# Patient Record
Sex: Female | Born: 2000 | Race: White | Hispanic: No | Smoking: Never smoker
Health system: Southern US, Community
[De-identification: ages and names within clinical notes are randomized; demographics above are authoritative.]

## PROBLEM LIST (undated history)

## (undated) DIAGNOSIS — J45909 Unspecified asthma, uncomplicated: Secondary | ICD-10-CM

## (undated) DIAGNOSIS — Z8719 Personal history of other diseases of the digestive system: Secondary | ICD-10-CM

## (undated) DIAGNOSIS — G901 Familial dysautonomia [Riley-Day]: Secondary | ICD-10-CM

## (undated) DIAGNOSIS — K589 Irritable bowel syndrome without diarrhea: Secondary | ICD-10-CM

## (undated) HISTORY — DX: Familial dysautonomia (riley-day): G90.1

## (undated) HISTORY — DX: Unspecified asthma, uncomplicated: J45.909

## (undated) HISTORY — DX: Irritable bowel syndrome, unspecified: K58.9

## (undated) HISTORY — PX: OTHER SURGICAL HISTORY: SHX169

## (undated) HISTORY — DX: Personal history of other diseases of the digestive system: Z87.19

---

## 2019-12-30 ENCOUNTER — Other Ambulatory Visit: Payer: Self-pay

## 2019-12-30 ENCOUNTER — Encounter: Payer: Self-pay | Admitting: Family Medicine

## 2019-12-30 ENCOUNTER — Ambulatory Visit (INDEPENDENT_AMBULATORY_CARE_PROVIDER_SITE_OTHER): Payer: No Typology Code available for payment source | Admitting: Family Medicine

## 2019-12-30 VITALS — BP 120/70 | HR 81 | Temp 99.0°F | Ht 65.0 in | Wt 134.6 lb

## 2019-12-30 DIAGNOSIS — D225 Melanocytic nevi of trunk: Secondary | ICD-10-CM | POA: Insufficient documentation

## 2019-12-30 DIAGNOSIS — R5383 Other fatigue: Secondary | ICD-10-CM | POA: Diagnosis not present

## 2019-12-30 DIAGNOSIS — J4520 Mild intermittent asthma, uncomplicated: Secondary | ICD-10-CM | POA: Diagnosis not present

## 2019-12-30 DIAGNOSIS — J45909 Unspecified asthma, uncomplicated: Secondary | ICD-10-CM | POA: Insufficient documentation

## 2019-12-30 DIAGNOSIS — R194 Change in bowel habit: Secondary | ICD-10-CM | POA: Insufficient documentation

## 2019-12-30 DIAGNOSIS — H538 Other visual disturbances: Secondary | ICD-10-CM

## 2019-12-30 DIAGNOSIS — N921 Excessive and frequent menstruation with irregular cycle: Secondary | ICD-10-CM | POA: Diagnosis not present

## 2019-12-30 LAB — POCT URINALYSIS DIP (PROADVANTAGE DEVICE)
Bilirubin, UA: NEGATIVE
Blood, UA: NEGATIVE
Glucose, UA: NEGATIVE mg/dL
Ketones, POC UA: NEGATIVE mg/dL
Leukocytes, UA: NEGATIVE
Nitrite, UA: NEGATIVE
Protein Ur, POC: NEGATIVE mg/dL
Specific Gravity, Urine: 1.015
Urobilinogen, Ur: NEGATIVE
pH, UA: 8.5 — AB (ref 5.0–8.0)

## 2019-12-30 LAB — POCT URINE PREGNANCY: Preg Test, Ur: NEGATIVE

## 2019-12-30 MED ORDER — ALBUTEROL SULFATE HFA 108 (90 BASE) MCG/ACT IN AERS: 2.0000 | INHALATION_SPRAY | Freq: Four times a day (QID) | RESPIRATORY_TRACT | 0 refills | Status: DC | PRN

## 2019-12-30 NOTE — Patient Instructions (Signed)
Call and schedule an eye exam.   Call and schedule an OB/GYN visit.   You will hear from [facility].   At some point, you may want to schedule with a dermatologist.       Obgyn Offices:   [facility] [address] [place], [place]  [facility] Address: [address] [place] [address] [place], [place] [address] Phone: [phone]  [place] [address] [place], [place] [address] Phone: [phone]  [place], [place] [address] Phone: [phone]  Here is a list of Eye Doctors that you call and schedule an appointment with.   [facility] Address: [place], [place], [place] [address] Phone: [phone]   [facility] Address: [address] [place], [place], [place] [address] Phone: [phone]  Dr. Katy Fitch Address: [address] [place], [place] [address] Phone: [phone]   Dermatology offices  [facility]: Phone #: [phone] Address: [address] [place], [place] [address]  [facility]: Phone: [phone]  Address: [address] [place], [place] [address]  [facility]: [phone] Address: [address] [place], [place] [address]  [facility] Address: [place], [place],  [address] Phone: [phone]

## 2019-12-30 NOTE — Progress Notes (Signed)
Subjective:    Patient ID: Laura Dudley, female    DOB: [birth year], 19 y.o.   MRN: [id number]  HPI Chief Complaint  Patient presents with  . new pt    new pt, discuss birth control, blurry vision that comes and goes for the last couple months, stomach pain- x 1 month dx with ibs 2017.    She is new to the practice and here to establish care. Moved here in May 2021. [place] in [place] here to live with her boyfriend. Her family is still in [place] States she is planning to take classes at [employer]  Working at [employer] on [employer].   She has several concerns today.   Blurry vision- once daily she has bilateral blurry vision. Has been worsening. Lasts 10 seconds up to 2 minutes.  No headache or dizziness. Has not seen an eye doctor in years. Visual acuity is ok  Asthma- diagnosed in her childhood. Does not currently have an albuterol inhaler.  Triggers are mainly exercise induced, also pollen.  She has been on Singulair in the past.   IBS hx per patient. No records available.  diagnosed with IBS at age 19. Takes Bentyl and states it used to work but lately it has not.  Stomach spasms still an issue.  Hx of intermittent diarrhea and constipation.  States she has been missing work because of this.  Fodmap diet is being followed.   States she has a bowel movement every hour now. She used to have bowel movements after eating. No blood in her stool.    Periods- irregular even on OCPs. Heavy and prolonged periods. She occasionally misses doses. Is interested in another form of birth control.   Complains of fatigue. Sleeping well but does not feel rested.   Mole on her back. Family history of melanoma   ADHD- no records available currently. Need to request.  States she was diagnosed in middle school and was on medication until she graduated from HS.   Denies fever, chills, dizziness, chest pain, palpitations, shortness of breath, cough.   Reviewed allergies,  medications, past medical, surgical, family, and social history.    Review of Systems Pertinent positives and negatives in the history of present illness.     Objective:   Physical Exam Constitutional:      General: She is not in acute distress.    Appearance: Normal appearance. She is not ill-appearing.  Eyes:     General: No visual field deficit.    Extraocular Movements: Extraocular movements intact.     Conjunctiva/sclera: Conjunctivae normal.     Pupils: Pupils are equal, round, and reactive to light.  Cardiovascular:     Rate and Rhythm: Normal rate and regular rhythm.     Pulses: Normal pulses.     Heart sounds: Normal heart sounds.  Pulmonary:     Effort: Pulmonary effort is normal.     Breath sounds: Normal breath sounds.  Abdominal:     General: Abdomen is flat. Bowel sounds are normal. There is no distension.     Palpations: Abdomen is soft.     Tenderness: There is no abdominal tenderness. There is no guarding or rebound.  Musculoskeletal:     Cervical back: Normal range of motion and neck supple.  Lymphadenopathy:     Cervical: No cervical adenopathy.  Skin:    General: Skin is warm and dry.     Comments: Right lower back with a 3 to 4  mm irregular, raised mole with changes and hyperpigmentation.  Neurological:     General: No focal deficit present.     Mental Status: She is alert and oriented to person, place, and time.     Cranial Nerves: No cranial nerve deficit.     Sensory: Sensation is intact.     Motor: Motor function is intact.     Coordination: Coordination is intact.  Psychiatric:        Mood and Affect: Mood normal.        Thought Content: Thought content normal.        Judgment: Judgment normal.    BP 120/70   Pulse 81   Temp 99 F (37.2 C) (Oral)   Ht 5\' 5"  (1.651 m)   Wt 134 lb 9.6 oz (61.1 kg)   LMP 11/29/2019   BMI 22.40 kg/m        Assessment & Plan:  Mild intermittent asthma without complication - Plan: albuterol (VENTOLIN  HFA) 108 (90 Base) MCG/ACT inhaler -Asthma since childhood. No recent flares. I will prescribe albuterol since she has been out of this. Counseling on the fact that if she is needing this more than twice per week that she should let me know because her asthma is not controlled. Consider starting her back on Singulair if needed for allergies.  Blurry vision, bilateral - Plan: POCT Urinalysis DIP (Proadvantage Device) -Visual acuity is fine. No headaches or dizziness associated. No red flag symptoms. I did discuss my concern and recommend she call and schedule with an ophthalmologist. She agrees.  Fatigue, unspecified type - Plan: CBC with Differential/Platelet, TSH, T4, free, T3, Vitamin B12, VITAMIN D 25 Hydroxy (Vit-D Deficiency, Fractures), Iron, TIBC and Ferritin Panel, Comprehensive metabolic panel, POCT Urinalysis DIP (Proadvantage Device), POCT urine pregnancy -Discussed multiple etiologies for fatigue including anemia. We will check labs and follow-up  Menorrhagia with irregular cycle - Plan: TSH, T4, free, T3, Iron, TIBC and Ferritin Panel -Currently taking birth control pills. She does occasionally miss a dose. UPT is negative today. She is interested in other forms of birth control. She will call and schedule with an OB/GYN. I did provide her with a list.  Bowel habit changes - Plan: Ambulatory referral to Gastroenterology -Reportedly has a history of IBS diagnosed in [place]. No records available today. Recent bowel habit changes are concerning. I will refer her to GI for further evaluation  Breakthrough bleeding on birth control pills - Plan: POCT urine pregnancy -UPT negative. Refer to OB/GYN  Atypical nevus- She will call and schedule with a dermatologist

## 2019-12-31 ENCOUNTER — Encounter: Payer: Self-pay | Admitting: Gastroenterology

## 2019-12-31 ENCOUNTER — Encounter: Payer: Self-pay | Admitting: Family Medicine

## 2019-12-31 DIAGNOSIS — E559 Vitamin D deficiency, unspecified: Secondary | ICD-10-CM

## 2019-12-31 HISTORY — DX: Vitamin D deficiency, unspecified: E55.9

## 2019-12-31 LAB — CBC WITH DIFFERENTIAL/PLATELET
Basophils Absolute: 0 10*3/uL (ref 0.0–0.2)
Basos: 0 %
EOS (ABSOLUTE): 0.1 10*3/uL (ref 0.0–0.4)
Eos: 1 %
Hematocrit: 38.3 % (ref 34.0–46.6)
Hemoglobin: 12.8 g/dL (ref 11.1–15.9)
Immature Grans (Abs): 0 10*3/uL (ref 0.0–0.1)
Immature Granulocytes: 0 %
Lymphocytes Absolute: 1.6 10*3/uL (ref 0.7–3.1)
Lymphs: 24 %
MCH: 27.9 pg (ref 26.6–33.0)
MCHC: 33.4 g/dL (ref 31.5–35.7)
MCV: 84 fL (ref 79–97)
Monocytes Absolute: 0.5 10*3/uL (ref 0.1–0.9)
Monocytes: 7 %
Neutrophils Absolute: 4.5 10*3/uL (ref 1.4–7.0)
Neutrophils: 68 %
Platelets: 253 10*3/uL (ref 150–450)
RBC: 4.58 x10E6/uL (ref 3.77–5.28)
RDW: 12 % (ref 11.7–15.4)
WBC: 6.6 10*3/uL (ref 3.4–10.8)

## 2019-12-31 LAB — COMPREHENSIVE METABOLIC PANEL
ALT: 15 IU/L (ref 0–32)
AST: 14 IU/L (ref 0–40)
Albumin/Globulin Ratio: 1.8 (ref 1.2–2.2)
Albumin: 4.3 g/dL (ref 3.9–5.0)
Alkaline Phosphatase: 91 IU/L (ref 42–106)
BUN/Creatinine Ratio: 10 (ref 9–23)
BUN: 7 mg/dL (ref 6–20)
Bilirubin Total: 0.3 mg/dL (ref 0.0–1.2)
CO2: 22 mmol/L (ref 20–29)
Calcium: 9.3 mg/dL (ref 8.7–10.2)
Chloride: 103 mmol/L (ref 96–106)
Creatinine, Ser: 0.72 mg/dL (ref 0.57–1.00)
GFR calc Af Amer: 139 mL/min/{1.73_m2} (ref >59)
GFR calc non Af Amer: 121 mL/min/{1.73_m2} (ref >59)
Globulin, Total: 2.4 g/dL (ref 1.5–4.5)
Glucose: 79 mg/dL (ref 65–99)
Potassium: 4 mmol/L (ref 3.5–5.2)
Sodium: 138 mmol/L (ref 134–144)
Total Protein: 6.7 g/dL (ref 6.0–8.5)

## 2019-12-31 LAB — T4, FREE: Free T4: 1.16 ng/dL (ref 0.82–1.77)

## 2019-12-31 LAB — IRON,TIBC AND FERRITIN PANEL
Ferritin: 59 ng/mL (ref 15–150)
Iron Saturation: 19 % (ref 15–55)
Iron: 63 ug/dL (ref 27–159)
Total Iron Binding Capacity: 340 ug/dL (ref 250–450)
UIBC: 277 ug/dL (ref 131–425)

## 2019-12-31 LAB — TSH: TSH: 0.995 u[IU]/mL (ref 0.450–4.500)

## 2019-12-31 LAB — VITAMIN B12: Vitamin B-12: 656 pg/mL (ref 232–1245)

## 2019-12-31 LAB — VITAMIN D 25 HYDROXY (VIT D DEFICIENCY, FRACTURES): Vit D, 25-Hydroxy: 24 ng/mL — ABNORMAL LOW (ref 30.0–100.0)

## 2019-12-31 LAB — T3: T3, Total: 164 ng/dL (ref 71–180)

## 2020-01-15 ENCOUNTER — Telehealth: Payer: Self-pay | Admitting: Family Medicine

## 2020-01-15 NOTE — Telephone Encounter (Signed)
Received requested records from [facility]

## 2020-02-17 ENCOUNTER — Encounter: Payer: Self-pay | Admitting: Gastroenterology

## 2020-02-17 ENCOUNTER — Encounter: Payer: Self-pay | Admitting: Family Medicine

## 2020-02-17 ENCOUNTER — Ambulatory Visit (INDEPENDENT_AMBULATORY_CARE_PROVIDER_SITE_OTHER): Payer: No Typology Code available for payment source | Admitting: Gastroenterology

## 2020-02-17 VITALS — BP 100/72 | HR 94 | Ht 65.0 in | Wt 135.0 lb

## 2020-02-17 DIAGNOSIS — K582 Mixed irritable bowel syndrome: Secondary | ICD-10-CM | POA: Diagnosis not present

## 2020-02-17 DIAGNOSIS — F909 Attention-deficit hyperactivity disorder, unspecified type: Secondary | ICD-10-CM

## 2020-02-17 HISTORY — DX: Attention-deficit hyperactivity disorder, unspecified type: F90.9

## 2020-02-17 MED ORDER — DICYCLOMINE HCL 10 MG PO CAPS: 10.0000 mg | ORAL_CAPSULE | Freq: Three times a day (TID) | ORAL | 11 refills | Status: DC

## 2020-02-17 NOTE — Patient Instructions (Addendum)
If you are age 20 or older, your body mass index should be between 23-30. Your Body mass index is 22.47 kg/m. If this is out of the aforementioned range listed, please consider follow up with your Primary Care Provider.  If you are age 69 or younger, your body mass index should be between 19-25. Your Body mass index is 22.47 kg/m. If this is out of the aformentioned range listed, please consider follow up with your Primary Care Provider.   We have sent the following medications to your pharmacy for you to pick up at your convenience: Dicyclomine 10 mg three times daily before meals.  Start IB guard three times daily   We have provided a low fodmap for you to follow.   Thank you for choosing me and [facility].  Pricilla Riffle. Dagoberto Ligas., MD., Marval Regal

## 2020-02-17 NOTE — Progress Notes (Signed)
History of Present Illness: This is a 19 year old female referred by Harland Dingwall L, NP-C for the evaluation of abdominal pain and alternating bowel habits.  She relates a history of IBS with an alternating bowel pattern diagnosed at age 67.  Over time she has determined that she is very sensitive to gluten and moderately sensitive to lactose.  She strictly avoids gluten and only allows a small amount of lactose which does not cause problems.  She recently discovered that ketchup induces symptoms and she recently eliminated it from her diet with a substantial improvement in pain and diarrhea.  Her bowel pattern alternates from mild constipation to normal to significant diarrhea. Abdominal pain is generally upper however it can generalize. Sometimes she notes low back pain with severe diarrhea.  She has not previously had colonoscopy.  She feels dicyclomine is helpful for pain however if she takes it TID it often causes constipation. She relocated here from [facility] and has established with Mack Hook, NP-C as her PCP.  CMP, CBC, iron studies, TSH, B12 all normal in Sept. Vit D was low at 24 in Sept. Denies weight loss, change in stool caliber, melena, hematochezia, nausea, vomiting, dysphagia, reflux symptoms, chest pain.    Not on File Outpatient Medications Prior to Visit  Medication Sig Dispense Refill  . albuterol (VENTOLIN HFA) 108 (90 Base) MCG/ACT inhaler Inhale 2 puffs into the lungs every 6 (six) hours as needed for wheezing or shortness of breath. 8 g 0  . dicyclomine (BENTYL) 20 MG tablet Take 20 mg by mouth daily.    . Norgestimate-Ethinyl Estradiol Triphasic (TRI-LO-SPRINTEC) 0.18/0.215/0.25 MG-25 MCG tab Take 1 tablet by mouth daily.     No facility-administered medications prior to visit.   Past Medical History:  Diagnosis Date  . Asthma   . H/O pyloric stenosis   . IBS (irritable bowel syndrome)   . Vitamin D deficiency 12/31/2019   Past Surgical History:  Procedure  Laterality Date  . pyloric stenosis repair     Social History   Socioeconomic History  . Marital status: Single    Spouse name: Not on file  . Number of children: Not on file  . Years of education: Not on file  . Highest education level: Not on file  Occupational History  . Not on file  Tobacco Use  . Smoking status: Never Smoker  . Smokeless tobacco: Never Used  Vaping Use  . Vaping Use: Never used  Substance and Sexual Activity  . Alcohol use: Not Currently  . Drug use: Yes    Types: Marijuana  . Sexual activity: Yes    Birth control/protection: OCP  Other Topics Concern  . Not on file  Social History Narrative  . Not on file   Social Determinants of Health   Financial Resource Strain:   . Difficulty of Paying Living Expenses: Not on file  Food Insecurity:   . Worried About Charity fundraiser in the Last Year: Not on file  . Ran Out of Food in the Last Year: Not on file  Transportation Needs:   . Lack of Transportation (Medical): Not on file  . Lack of Transportation (Non-Medical): Not on file  Physical Activity:   . Days of Exercise per Week: Not on file  . Minutes of Exercise per Session: Not on file  Stress:   . Feeling of Stress : Not on file  Social Connections:   . Frequency of Communication with Friends and Family:  Not on file  . Frequency of Social Gatherings with Friends and Family: Not on file  . Attends Religious Services: Not on file  . Active Member of Clubs or Organizations: Not on file  . Attends Archivist Meetings: Not on file  . Marital Status: Not on file   Family History  Problem Relation Age of Onset  . Asthma Mother   . Gout Father   . Bipolar disorder Maternal Grandmother   . Depression Maternal Grandmother   . Melanoma Paternal Grandfather   . Colon cancer Neg Hx   . Stomach cancer Neg Hx       Review of Systems: Pertinent positive and negative review of systems were noted in the above HPI section. All other review  of systems were otherwise negative.   Physical Exam: General: Well developed, well nourished, no acute distress Head: Normocephalic and atraumatic Eyes:  sclerae anicteric, EOMI Ears: Normal auditory acuity Mouth: Not examined, mask on during Covid-19 pandemic Neck: Supple, no masses or thyromegaly Lungs: Clear throughout to auscultation Heart: Regular rate and rhythm; no murmurs, rubs or bruits Abdomen: Soft, non tender and non distended. No masses, hepatosplenomegaly or hernias noted. Normal Bowel sounds Rectal: Not done Musculoskeletal: Symmetrical with no gross deformities  Skin: No lesions on visible extremities Pulses:  Normal pulses noted Extremities: No clubbing, cyanosis, edema or deformities noted Neurological: Alert oriented x 4, grossly nonfocal Cervical Nodes:  No significant cervical adenopathy Inguinal Nodes: No significant inguinal adenopathy Psychological:  Alert and cooperative. Normal mood and affect   Assessment and Recommendations:  1. IBS with an alternating pattern. Severe intolerance to gluten and moderate intolerance to lactose. Celiac testing has not been done as she has been gluten free. Ketchup recently found to induce abdominal pain and diarrhea.   Closely review low FODMAP diet for additional dietary stressors.  Maintain a regular high daily water intake.  Reduce dicyclomine to 10 mg po tid prn.  IBGard 1-2 po tid prn if dicyclomine is not fully effective.  Miralax qd prn constipation.  Trial of a few different probiotics for 2-3 weeks each if symptoms not are well controlled.  Colonoscopy and HLA DQ8 and DQ2 if symptoms are not well controlled to R/O IBD, neoplasm, celiac disease.  Attempt to obtain and review records from her prior PCP in [place].  REV in 1 year if symptoms are well controlled.  Strongly encouraged to schedule GI follow up prior to her annual visit if she is not well controlled.   2.  Pyloric stenosis as an infant, status post  myotomy.   cc: Girtha Rm, NP-C [place],   [phone]

## 2020-10-29 ENCOUNTER — Encounter: Payer: Self-pay | Admitting: Family Medicine

## 2020-11-01 NOTE — Progress Notes (Signed)
Chief Complaint  Patient presents with   Wrist Pain    Right wrist pain. Broke wrist 2 years ago. Was doing ok. Started bothering her again a couple weeks ago. Looking for a referral to ortho.     Patient reports fracturing her right wrist in January of 2021. She was treated with a cast, and physical therapy.  She is requesting referral to ortho due to complaints of recurrent R wrist pain for the last 3-4 months.  Manager of a garage--moves tires, heavy things.  No pain at rest, but any pressure--opening a door, pushing on heavy doors, or turning handles sometimes, causes pain.  No numbness/tingling Pain is at the back of the wrist and base of the thumb    PMH, PSH, SH reviewed  H/o IBS and stomach issues  Outpatient Encounter Medications as of 11/02/2020  Medication Sig Note   dicyclomine (BENTYL) 10 MG capsule Take 1 capsule (10 mg total) by mouth 3 (three) times daily before meals.    Multiple Vitamin (MULTI-VITAMIN PO) Take 2 each by mouth. 11/02/2020: Gummy   albuterol (VENTOLIN HFA) 108 (90 Base) MCG/ACT inhaler Inhale 2 puffs into the lungs every 6 (six) hours as needed for wheezing or shortness of breath. (Patient not taking: Reported on 11/02/2020)    [DISCONTINUED] Norgestimate-Ethinyl Estradiol Triphasic 0.18/0.215/0.25 MG-25 MCG tab Take 1 tablet by mouth daily.    No facility-administered encounter medications on file as of 11/02/2020.     No Known Allergies Recalls having an issue with an acid medication--no idea which one it was to record as an allergy (throat closed up, r/b benadryl).  ROS: Denies fever, chills, URI symptoms, headaches, dizziness, shortness of breath, chest pain.  Denies nausea, vomiting, bowel changes, urinary complaints, bleeding, bruising, rash. Currently denies abdominal pain See HPI   PHYSICAL EXAM:  BP 108/64   Pulse 64   Ht '5\' 5"'$  (1.651 m)   Wt 131 lb 6.4 oz (59.6 kg)   BMI 21.87 kg/m   Well-appearing, pleasant female, in no  distress Right Wrist--FROM, normal appearance, no redness or swelling Pain with R wrist extension against resistance. No pain with flexion against resistance Negative Tinel and Phalen Mildly tender over distal radius Negative Finkelstein test  ASSESSMENT/PLAN:  Right wrist pain - will treat for poss overuse/tendonitis with brace and NSAID (risks/SE reviewed).  - Plan: DG Wrist Complete Right  History of fracture of wrist - check XR to ensure proper healing from prior fracture - Plan: DG Wrist Complete Right   Refer to ortho if persistent pain, or abnormal x-ray

## 2020-11-02 ENCOUNTER — Other Ambulatory Visit: Payer: Self-pay

## 2020-11-02 ENCOUNTER — Ambulatory Visit (INDEPENDENT_AMBULATORY_CARE_PROVIDER_SITE_OTHER): Payer: BC Managed Care – PPO | Admitting: Family Medicine

## 2020-11-02 ENCOUNTER — Encounter: Payer: Self-pay | Admitting: Family Medicine

## 2020-11-02 VITALS — BP 108/64 | HR 64 | Ht 65.0 in | Wt 131.4 lb

## 2020-11-02 DIAGNOSIS — M25531 Pain in right wrist: Secondary | ICD-10-CM

## 2020-11-02 DIAGNOSIS — Z8781 Personal history of (healed) traumatic fracture: Secondary | ICD-10-CM | POA: Diagnosis not present

## 2020-11-02 NOTE — Patient Instructions (Addendum)
Go to [facility] (301 or 315) for xray of the wrist.   Use a wrist brace during the day (from pharmacy, ie carpal tunnel brace) Take Aleve twice daily with food. Prescription equivalent is 2 pills twice daily, but let's start with 1 sice you have stomach issues, and since I'm not sure which stomach medication would be safe to give you if you start having pain (I usually suggest Pepcid or Prilosec, to take along with the anti-inflammatory if it bothers your stomach).  Take Aleve for up to 2 weeks (usually 7-10 days is enough), until your pain has resolved. If pain isn't improving, let us know and we can refer you to the orthopedist.  I'll refer you sooner if any abnormality on x-ray (poor healing, etc).

## 2020-11-03 ENCOUNTER — Ambulatory Visit
Admission: RE | Admit: 2020-11-03 | Discharge: 2020-11-03 | Disposition: A | Discharge status: Home / Self Care | Payer: BC Managed Care – PPO | Source: Ambulatory Visit | Attending: Family Medicine | Admitting: Family Medicine

## 2020-11-03 DIAGNOSIS — M25531 Pain in right wrist: Secondary | ICD-10-CM

## 2020-11-03 DIAGNOSIS — Z8781 Personal history of (healed) traumatic fracture: Secondary | ICD-10-CM

## 2020-11-12 ENCOUNTER — Other Ambulatory Visit (INDEPENDENT_AMBULATORY_CARE_PROVIDER_SITE_OTHER): Payer: BC Managed Care – PPO

## 2020-11-12 ENCOUNTER — Ambulatory Visit (INDEPENDENT_AMBULATORY_CARE_PROVIDER_SITE_OTHER): Payer: BC Managed Care – PPO | Admitting: Gastroenterology

## 2020-11-12 ENCOUNTER — Encounter: Payer: Self-pay | Admitting: Gastroenterology

## 2020-11-12 VITALS — BP 90/60 | HR 110 | Ht 64.0 in | Wt 130.0 lb

## 2020-11-12 DIAGNOSIS — K588 Other irritable bowel syndrome: Secondary | ICD-10-CM

## 2020-11-12 DIAGNOSIS — R1011 Right upper quadrant pain: Secondary | ICD-10-CM

## 2020-11-12 LAB — COMPREHENSIVE METABOLIC PANEL
ALT: 13 U/L (ref 0–35)
AST: 14 U/L (ref 0–37)
Albumin: 4.3 g/dL (ref 3.5–5.2)
Alkaline Phosphatase: 60 U/L (ref 39–117)
BUN: 10 mg/dL (ref 6–23)
CO2: 23 mEq/L (ref 19–32)
Calcium: 9.1 mg/dL (ref 8.4–10.5)
Chloride: 105 mEq/L (ref 96–112)
Creatinine, Ser: 0.79 mg/dL (ref 0.40–1.20)
GFR: 107.04 mL/min (ref >60.00)
Glucose, Bld: 89 mg/dL (ref 70–99)
Potassium: 4 mEq/L (ref 3.5–5.1)
Sodium: 138 mEq/L (ref 135–145)
Total Bilirubin: 0.6 mg/dL (ref 0.2–1.2)
Total Protein: 7.1 g/dL (ref 6.0–8.3)

## 2020-11-12 LAB — CBC WITH DIFFERENTIAL/PLATELET
Basophils Absolute: 0 10*3/uL (ref 0.0–0.1)
Basophils Relative: 0.3 % (ref 0.0–3.0)
Eosinophils Absolute: 0 10*3/uL (ref 0.0–0.7)
Eosinophils Relative: 1 % (ref 0.0–5.0)
HCT: 38.1 % (ref 36.0–46.0)
Hemoglobin: 12.7 g/dL (ref 12.0–15.0)
Lymphocytes Relative: 25.9 % (ref 12.0–46.0)
Lymphs Abs: 1.1 10*3/uL (ref 0.7–4.0)
MCHC: 33.3 g/dL (ref 30.0–36.0)
MCV: 86.4 fl (ref 78.0–100.0)
Monocytes Absolute: 0.3 10*3/uL (ref 0.1–1.0)
Monocytes Relative: 6.4 % (ref 3.0–12.0)
Neutro Abs: 2.9 10*3/uL (ref 1.4–7.7)
Neutrophils Relative %: 66.4 % (ref 43.0–77.0)
Platelets: 197 10*3/uL (ref 150.0–400.0)
RBC: 4.41 Mil/uL (ref 3.87–5.11)
RDW: 12.6 % (ref 11.5–15.5)
WBC: 4.4 10*3/uL (ref 4.0–10.5)

## 2020-11-12 LAB — LIPASE: Lipase: 33 U/L (ref 11.0–59.0)

## 2020-11-12 NOTE — Progress Notes (Signed)
    History of Present Illness: This is a 20 year old female who relates intermittent right upper quadrant pain for 1 month.  Symptoms are not necessarily related to meals or bowel movements.  They have been associated with nausea without vomiting.  She she relates her new RUQ pain are quite different than the abdominal pain and bowel habit variations she often notes with certain foods such as gluten or lactose products.  RUQ pain does not radiate and there is no burning quality.  Right upper quadrant pain is not relieved with dicyclomine or IBgard which have been effective for controlling her prior abdominal pain.  Current Medications, Allergies, Past Medical History, Past Surgical History, Family History and Social History were reviewed in [employer] record.   Physical Exam: General: Well developed, well nourished, no acute distress Head: Normocephalic and atraumatic Eyes: Sclerae anicteric, EOMI Ears: Normal auditory acuity Mouth: Not examined, mask on during Covid-19 pandemic Lungs: Clear throughout to auscultation Heart: Regular rate and rhythm; no murmurs, rubs or bruits Abdomen: Soft, non tender and non distended. No masses, hepatosplenomegaly or hernias noted. Normal Bowel sounds Rectal: Not done  Musculoskeletal: Symmetrical with no gross deformities  Pulses:  Normal pulses noted Extremities: No clubbing, cyanosis, edema or deformities noted Neurological: Alert oriented x 4, grossly nonfocal Psychological:  Alert and cooperative. Normal mood and affect   Assessment and Recommendations:  RUQ pain.  Rule out cholelithiasis, gastritis, ulcer. CBC, CMP, lipase.  Schedule RUQ [place].  Prilosec OTC 20 mg daily.  Schedule EGD. The risks (including bleeding, perforation, infection, missed lesions, medication reactions and possible hospitalization or surgery if complications occur), benefits, and alternatives to endoscopy with possible biopsy and possible dilation were  discussed with the patient and they consent to proceed.  IBS vs food intolerances.  Encouraged to use IBgard 1-2 p.o. 3 times daily as needed and reduce the use of dicyclomine as it leads to constipation.  She strictly avoids gluten and minimizes lactose.  She has noted several other foods on the low FODMAP diet that cause abdominal pain bowel habit changes and she avoids them.  Send HLA DQ.

## 2020-11-12 NOTE — Patient Instructions (Signed)
Your provider has requested that you go to the basement level for lab work before leaving today. Press "B" on the elevator. The lab is located at the first door on the left as you exit the elevator.  You have been scheduled for an endoscopy. Please follow written instructions given to you at your visit today. If you use inhalers (even only as needed), please bring them with you on the day of your procedure.  You have been scheduled for an abdominal ultrasound at [facility] (1st floor of hospital) on 11/16/20 at 8:00am. Please arrive 15 minutes prior to your appointment for registration. Make certain not to have anything to eat or drink 6 hours prior to your appointment. Should you need to reschedule your appointment, please contact radiology at [phone]. This test typically takes about 30 minutes to perform.  Due to recent changes in healthcare laws, you may see the results of your imaging and laboratory studies on MyChart before your provider has had a chance to review them.  We understand that in some cases there may be results that are confusing or concerning to you. Not all laboratory results come back in the same time frame and the provider may be waiting for multiple results in order to interpret others.  Please give [place] 48 hours in order for your provider to thoroughly review all the results before contacting the office for clarification of your results.   The [facility] providers would like to encourage you to use [facility] to communicate with providers for non-urgent requests or questions.  Due to long hold times on the telephone, sending your provider a message by [facility] may be a faster and more efficient way to get a response.  Please allow 48 business hours for a response.  Please remember that this is for non-urgent requests.   Normal BMI (Body Mass Index- based on height and weight) is between 19 and 25. Your BMI today is Body mass index is 22.31 kg/m. Marland Kitchen Please consider follow up   regarding your BMI with your Primary Care Provider.  Thank you for choosing me and [facility].  Pricilla Riffle. Dagoberto Ligas., MD., Marval Regal

## 2020-11-16 ENCOUNTER — Ambulatory Visit (HOSPITAL_COMMUNITY)
Admission: RE | Admit: 2020-11-16 | Discharge: 2020-11-16 | Disposition: A | Discharge status: Home / Self Care | Payer: BC Managed Care – PPO | Source: Ambulatory Visit | Attending: Gastroenterology | Admitting: Gastroenterology

## 2020-11-16 ENCOUNTER — Other Ambulatory Visit: Payer: Self-pay

## 2020-11-16 DIAGNOSIS — R1011 Right upper quadrant pain: Secondary | ICD-10-CM | POA: Insufficient documentation

## 2020-11-16 DIAGNOSIS — K588 Other irritable bowel syndrome: Secondary | ICD-10-CM | POA: Diagnosis present

## 2020-11-23 LAB — CELIAC DISEASE HLA DQ ASSOC.
DQ2 (DQA1 0501/0505,DQB1 02XX): NEGATIVE
DQ8 (DQA1 03XX, DQB1 0302): POSITIVE

## 2020-12-02 ENCOUNTER — Encounter: Payer: BC Managed Care – PPO | Admitting: Gastroenterology

## 2020-12-07 ENCOUNTER — Ambulatory Visit (AMBULATORY_SURGERY_CENTER): Payer: BC Managed Care – PPO | Admitting: Gastroenterology

## 2020-12-07 ENCOUNTER — Other Ambulatory Visit: Payer: Self-pay

## 2020-12-07 ENCOUNTER — Encounter: Payer: Self-pay | Admitting: Gastroenterology

## 2020-12-07 ENCOUNTER — Other Ambulatory Visit: Payer: Self-pay | Admitting: Gastroenterology

## 2020-12-07 VITALS — BP 108/56 | HR 70 | Temp 99.1°F | Resp 11 | Ht 64.0 in | Wt 130.0 lb

## 2020-12-07 DIAGNOSIS — R1011 Right upper quadrant pain: Secondary | ICD-10-CM | POA: Diagnosis not present

## 2020-12-07 DIAGNOSIS — K319 Disease of stomach and duodenum, unspecified: Secondary | ICD-10-CM | POA: Diagnosis not present

## 2020-12-07 MED ORDER — SODIUM CHLORIDE 0.9 % IV SOLN: 500.0000 mL | Freq: Once | INTRAVENOUS | Status: DC

## 2020-12-07 NOTE — Progress Notes (Signed)
Pt's states no medical or surgical changes since previsit or office visit. 

## 2020-12-07 NOTE — Progress Notes (Signed)
1007 Robinul 0.1 mg IV given due large amount of secretions upon assessment.  MD made aware, vss 

## 2020-12-07 NOTE — Patient Instructions (Signed)
Resume previous diet and medications. Awaiting pathology results.  YOU HAD AN ENDOSCOPIC PROCEDURE TODAY AT [facility]:   Refer to the procedure report that was given to you for any specific questions about what was found during the examination.  If the procedure report does not answer your questions, please call your gastroenterologist to clarify.  If you requested that your care partner not be given the details of your procedure findings, then the procedure report has been included in a sealed envelope for you to review at your convenience later.  YOU SHOULD EXPECT: Some feelings of bloating in the abdomen. Passage of more gas than usual.  Walking can help get rid of the air that was put into your GI tract during the procedure and reduce the bloating. If you had a lower endoscopy (such as a colonoscopy or flexible sigmoidoscopy) you may notice spotting of blood in your stool or on the toilet paper. If you underwent a bowel prep for your procedure, you may not have a normal bowel movement for a few days.  Please Note:  You might notice some irritation and congestion in your nose or some drainage.  This is from the oxygen used during your procedure.  There is no need for concern and it should clear up in a day or so.  SYMPTOMS TO REPORT IMMEDIATELY:  Following upper endoscopy (EGD)  Vomiting of blood or coffee ground material  New chest pain or pain under the shoulder blades  Painful or persistently difficult swallowing  New shortness of breath  Fever of 100F or higher  Black, tarry-looking stools  For urgent or emergent issues, a gastroenterologist can be reached at any hour by calling [phone]. Do not use MyChart messaging for urgent concerns.    DIET:  We do recommend a small meal at first, but then you may proceed to your regular diet.  Drink plenty of fluids but you should avoid alcoholic beverages for 24 hours.  ACTIVITY:  You should plan to take it easy for the  rest of today and you should NOT DRIVE or use heavy machinery until tomorrow (because of the sedation medicines used during the test).    FOLLOW UP: Our staff will call the number listed on your records 48-72 hours following your procedure to check on you and address any questions or concerns that you may have regarding the information given to you following your procedure. If we do not reach you, we will leave a message.  We will attempt to reach you two times.  During this call, we will ask if you have developed any symptoms of COVID 19. If you develop any symptoms (ie: fever, flu-like symptoms, shortness of breath, cough etc.) before then, please call [phone].  If you test positive for Covid 19 in the 2 weeks post procedure, please call and report this information to [place].    If any biopsies were taken you will be contacted by phone or by letter within the next 1-3 weeks.  Please call us at [phone] if you have not heard about the biopsies in 3 weeks.    SIGNATURES/CONFIDENTIALITY: You and/or your care partner have signed paperwork which will be entered into your electronic medical record.  These signatures attest to the fact that that the information above on your After Visit Summary has been reviewed and is understood.  Full responsibility of the confidentiality of this discharge information lies with you and/or your care-partner.

## 2020-12-07 NOTE — Progress Notes (Signed)
C.W. vital signs. 

## 2020-12-07 NOTE — Progress Notes (Signed)
Report given to PACU, vss 

## 2020-12-07 NOTE — Op Note (Signed)
[place] Patient Name: Laura Dudley Procedure Date: 12/07/2020 10:00 AM MRN: [id number] Endoscopist: Ladene Artist , MD Age: 20 Referring MD:  Date of Birth: 09/30/2000 Gender: Female Account #: [id number] Procedure:                Upper GI endoscopy Indications:              Abdominal pain in the right upper quadrant Medicines:                Monitored Anesthesia Care Procedure:                Pre-Anesthesia Assessment:                           - Prior to the procedure, a History and Physical                            was performed, and patient medications and                            allergies were reviewed. The patient's tolerance of                            previous anesthesia was also reviewed. The risks                            and benefits of the procedure and the sedation                            options and risks were discussed with the patient.                            All questions were answered, and informed consent                            was obtained. Prior Anticoagulants: The patient has                            taken no previous anticoagulant or antiplatelet                            agents. ASA Grade Assessment: II - A patient with                            mild systemic disease. After reviewing the risks                            and benefits, the patient was deemed in                            satisfactory condition to undergo the procedure.                           After obtaining informed consent, the endoscope was  passed under direct vision. Throughout the                            procedure, the patient's blood pressure, pulse, and                            oxygen saturations were monitored continuously. The                            Endoscope was introduced through the mouth, and                            advanced to the second part of duodenum. The upper                            GI  endoscopy was accomplished without difficulty.                            The patient tolerated the procedure well. Scope In: Scope Out: Findings:                 The examined esophagus was normal.                           Patchy mildly erythematous mucosa without bleeding                            was found in the gastric body. Biopsies were taken                            with a cold forceps for histology.                           The exam of the stomach was otherwise normal.                           The duodenal bulb and second portion of the                            duodenum were normal. Biopsies for histology were                            taken with a cold forceps for evaluation of celiac                            disease. Complications:            No immediate complications. Estimated Blood Loss:     Estimated blood loss was minimal. Impression:               - Normal esophagus.                           - Erythematous mucosa in the gastric body. Biopsied.                           -  Normal duodenal bulb and second portion of the                            duodenum. Biopsied. Recommendation:           - Patient has a contact number available for                            emergencies. The signs and symptoms of potential                            delayed complications were discussed with the                            patient. Return to normal activities tomorrow.                            Written discharge instructions were provided to the                            patient.                           - Resume previous diet.                           - Continue present medications.                           - Await pathology results. Ladene Artist, MD 12/07/2020 10:22:54 AM This report has been signed electronically.

## 2020-12-07 NOTE — Progress Notes (Signed)
See 11/12/2020 H&P, no changes. This patient is appropriate for endoscopic procedures in the ambulatory setting.

## 2020-12-07 NOTE — Progress Notes (Signed)
Called to room to assist during endoscopic procedure.  Patient ID and intended procedure confirmed with present staff. Received instructions for my participation in the procedure from the performing physician.  

## 2020-12-09 ENCOUNTER — Telehealth: Payer: Self-pay

## 2020-12-09 NOTE — Telephone Encounter (Signed)
  Follow up Call-  Call back number 12/07/2020  Post procedure Call Back phone  # [phone]  Permission to leave phone message Yes     Patient questions:  Do you have a fever, pain , or abdominal swelling? No. Pain Score  0 *  Have you tolerated food without any problems? Yes.    Have you been able to return to your normal activities? Yes.    Do you have any questions about your discharge instructions: Diet   No. Medications  No. Follow up visit  No.  Do you have questions or concerns about your Care? No.  Actions: * If pain score is 4 or above: No action needed, pain <4.

## 2020-12-10 ENCOUNTER — Telehealth: Payer: Self-pay | Admitting: *Deleted

## 2020-12-10 NOTE — Telephone Encounter (Signed)
Pt called back this morning and she has been experiencing mid chest discomfort constantly 4/10 at the worst 7/10. "It bruns, I'm belching and have a lot of gas." Will let Dr. Fuller Plan know of this new symptom. Told the patient we were still awaiting pathology results.

## 2020-12-10 NOTE — Telephone Encounter (Signed)
Light diet for next 1-2 days Gas-X qid prn  Either IBgard (OTC) 1-2 po tid prn abdominal pain or FDgard (OTC) 1-2 po tid prn abdominal pain

## 2020-12-10 NOTE — Telephone Encounter (Signed)
Called the patient back and gave her the instructions  suggested by Dr. Fuller Plan. She verbalized understanding, I encouraged her to reach out on Tuesday if the symptoms didn't improve.

## 2020-12-24 ENCOUNTER — Encounter: Payer: Self-pay | Admitting: Gastroenterology

## 2020-12-28 NOTE — Telephone Encounter (Signed)
Pathology showed reactive gastropathy.  She is c/o continued abdominal pain.  She is not on a PPI.  Would you recommend any additional tx

## 2020-12-29 ENCOUNTER — Other Ambulatory Visit: Payer: Self-pay

## 2020-12-29 DIAGNOSIS — R1011 Right upper quadrant pain: Secondary | ICD-10-CM

## 2020-12-29 MED ORDER — FAMOTIDINE 40 MG PO TABS: 40.0000 mg | ORAL_TABLET | Freq: Every day | ORAL | 1 refills | Status: DC

## 2021-01-21 ENCOUNTER — Other Ambulatory Visit: Payer: Self-pay | Admitting: Gastroenterology

## 2021-02-04 ENCOUNTER — Other Ambulatory Visit: Payer: Self-pay | Admitting: Gastroenterology

## 2021-02-12 ENCOUNTER — Ambulatory Visit: Payer: BC Managed Care – PPO | Admitting: Gastroenterology

## 2021-06-07 ENCOUNTER — Other Ambulatory Visit: Payer: Self-pay

## 2021-06-07 ENCOUNTER — Ambulatory Visit (INDEPENDENT_AMBULATORY_CARE_PROVIDER_SITE_OTHER): Payer: BLUE CROSS/BLUE SHIELD | Admitting: Medical

## 2021-06-07 VITALS — BP 102/74 | HR 101 | Temp 99.1°F | Ht 64.0 in | Wt 125.8 lb

## 2021-06-07 DIAGNOSIS — R232 Flushing: Secondary | ICD-10-CM

## 2021-06-07 DIAGNOSIS — R0600 Dyspnea, unspecified: Secondary | ICD-10-CM | POA: Diagnosis not present

## 2021-06-07 DIAGNOSIS — G4489 Other headache syndrome: Secondary | ICD-10-CM

## 2021-06-07 DIAGNOSIS — N926 Irregular menstruation, unspecified: Secondary | ICD-10-CM

## 2021-06-07 DIAGNOSIS — R000 Tachycardia, unspecified: Secondary | ICD-10-CM | POA: Diagnosis not present

## 2021-06-07 DIAGNOSIS — H538 Other visual disturbances: Secondary | ICD-10-CM

## 2021-06-07 NOTE — Addendum Note (Signed)
Addended by: Carlena Hurl on: 06/07/2021 03:39 PM   Modules accepted: Orders

## 2021-06-07 NOTE — Progress Notes (Signed)
Subjective: Chief Complaint  Patient presents with   Asthma    More frequent labored breathing with or without activity No chest pains,  lightheaded some times  Started on 05/24/21 and has been on and off    Here for multiple symptoms. For the past week and a half she has felt symptoms including flushed hot flashes type feelings, has felt short of breath, headaches lately, vomited once, but the headache.  She has seen Jocelyn Lamer here before for blurred vision and saw an eye doctor in July 2021 for the same.  Reportedly there was nothing on her eyes.  Nevertheless she still gets blurred vision periodically.  She has a history of IBS diarrhea.  She has been on Bentyl once to twice daily since age 75 for this issue.  She notes a history of irregular periods for a long time.  She just finished up her last menstrual period.  Some months it can be 4 days, some days 8 days.  A few months ago she had 2 periods in 1 month.  She can get very bad crampy pain during periods.  Sexually active, uses condoms.  Has been on various birth controls in the past and nothing seems to regulate her periods.  She denies history of incontinence, ataxia.  No numbness or tingling.  She has had a few concussions in her lifetime.  The last 1 maybe a year ago and she hit her head on the car door.  She notes some stress but nothing out of the ordinary.  She does not think this is related to anxiety  She does not think this is related to asthma either because her inhaler does not seem to change her shortness of breath and does not feel like asthma.  She has not like she is got really bad allergy problems right now either.  She does tend to get some springtime allergy problems but this does not feel that way.  She is a non-smoker.  She has a history of vitamin D deficiency but not currently taking vitamin D..  She is using a multivitamin  No other aggravating or relieving factors. No other complaint.  Past Medical History:   Diagnosis Date   ADHD 02/17/2020   Asthma    H/O pyloric stenosis    IBS (irritable bowel syndrome)    Vitamin D deficiency 12/31/2019   Family History  Problem Relation Age of Onset   Asthma Mother    Gout Father    Bipolar disorder Maternal Grandmother    Depression Maternal Grandmother    Melanoma Paternal Grandfather    Colon cancer Neg Hx    Stomach cancer Neg Hx    Current Outpatient Medications on File Prior to Visit  Medication Sig Dispense Refill   albuterol (VENTOLIN HFA) 108 (90 Base) MCG/ACT inhaler Inhale 2 puffs into the lungs every 6 (six) hours as needed for wheezing or shortness of breath. 8 g 0   dicyclomine (BENTYL) 10 MG capsule Take 1 capsule (10 mg total) by mouth 3 (three) times daily before meals. 90 capsule 11   Multiple Vitamin (MULTI-VITAMIN PO) Take 2 each by mouth.     famotidine (PEPCID) 40 MG tablet TAKE 1 TABLET BY MOUTH EVERYDAY AT BEDTIME (Patient not taking: Reported on 06/07/2021) 90 tablet 1   Current Facility-Administered Medications on File Prior to Visit  Medication Dose Route Frequency Provider Last Rate Last Admin   0.9 %  sodium chloride infusion  500 mL Intravenous Once Ladene Artist,  MD        ROS as in subjective   Objective: BP 102/74    Pulse (!) 101    Temp 99.1 F (37.3 C)    Ht 5\' 4"  (1.626 m)    Wt 125 lb 12.8 oz (57.1 kg)    SpO2 98%    BMI 21.59 kg/m     General appearence: alert, no distress, WD/WN, lean white female No obvious hirsutism HEENT: normocephalic, sclerae anicteric, PERRLA, EOMi, nares patent, no discharge or erythema, pharynx normal Oral cavity: MMM, no lesions Neck: supple, no lymphadenopathy, no thyromegaly, no masses Heart: RRR, normal S1, S2, no murmurs Lungs: CTA bilaterally, no wheezes, rhonchi, or rales Extremities: no edema, no cyanosis, no clubbing Pulses: 2+ symmetric, upper and lower extremities, normal cap refill Neurological: alert, oriented x 3, CN2-12 intact, strength normal upper  extremities and lower extremities, sensation normal throughout, DTRs 2+ throughout, no cerebellar signs, gait normal Psychiatric: normal affect, behavior normal, pleasant   Visual fields normal   Assessment: Encounter Diagnoses  Name Primary?   Headache syndrome Yes   Blurred vision    Flushing    Dyspnea, unspecified type    Elevated pulse rate    Irregular periods     Plan: We discussed symptoms and concerns.  We discussed possible differential -asthma, anxiety, thyroid issues, other neuroendocrine issues.  Labs as below.  Consider head imaging or referral to either neurology or endocrinology depending on lab results.   Donelle was seen today for asthma.  Diagnoses and all orders for this visit:  Headache syndrome -     TSH -     Prolactin -     CBC -     T4, free  Blurred vision -     TSH -     Prolactin -     CBC -     T4, free  Flushing -     TSH -     Prolactin -     CBC -     T4, free  Dyspnea, unspecified type -     TSH -     Prolactin -     CBC -     T4, free  Elevated pulse rate -     TSH -     Prolactin -     CBC -     T4, free  Irregular periods -     TSH -     Prolactin -     CBC -     T4, free   F/u pending labs

## 2021-06-07 NOTE — Addendum Note (Signed)
Addended by: Carlena Hurl on: 06/07/2021 03:52 PM   Modules accepted: Orders

## 2021-06-08 LAB — CBC
Hematocrit: 41.4 % (ref 34.0–46.6)
Hemoglobin: 13.7 g/dL (ref 11.1–15.9)
MCH: 28.5 pg (ref 26.6–33.0)
MCHC: 33.1 g/dL (ref 31.5–35.7)
MCV: 86 fL (ref 79–97)
Platelets: 218 10*3/uL (ref 150–450)
RBC: 4.81 x10E6/uL (ref 3.77–5.28)
RDW: 12 % (ref 11.7–15.4)
WBC: 5 10*3/uL (ref 3.4–10.8)

## 2021-06-08 LAB — BETA HCG QUANT (REF LAB): hCG Quant: <1 m[IU]/mL

## 2021-06-08 LAB — PROLACTIN: Prolactin: 10.8 ng/mL (ref 4.8–23.3)

## 2021-06-08 LAB — TSH: TSH: 0.832 u[IU]/mL (ref 0.450–4.500)

## 2021-06-08 LAB — T4, FREE: Free T4: 1.11 ng/dL (ref 0.82–1.77)

## 2021-06-20 DIAGNOSIS — B085 Enteroviral vesicular pharyngitis: Secondary | ICD-10-CM | POA: Diagnosis not present

## 2021-06-24 ENCOUNTER — Other Ambulatory Visit: Payer: Self-pay | Admitting: Medical

## 2021-06-24 NOTE — Progress Notes (Signed)
refe

## 2021-06-25 ENCOUNTER — Other Ambulatory Visit: Payer: Self-pay | Admitting: Medical

## 2021-06-25 ENCOUNTER — Telehealth: Payer: Self-pay

## 2021-06-25 DIAGNOSIS — R079 Chest pain, unspecified: Secondary | ICD-10-CM | POA: Diagnosis not present

## 2021-06-25 DIAGNOSIS — R000 Tachycardia, unspecified: Secondary | ICD-10-CM | POA: Diagnosis not present

## 2021-06-25 DIAGNOSIS — Z87898 Personal history of other specified conditions: Secondary | ICD-10-CM

## 2021-06-25 NOTE — Telephone Encounter (Signed)
Pt states she has been checking it with her apple watch several times a day. Ranging from 98-130. Right now it was 107. ?

## 2021-06-25 NOTE — Telephone Encounter (Signed)
Pt was notified.  

## 2021-06-25 NOTE — Progress Notes (Signed)
cardio

## 2021-06-25 NOTE — Telephone Encounter (Signed)
Pt states she went to Urgent Care this morning for high heart rate and they recommend referral to Cardiologist,  never been before so whoever is fine.  She states she saw you a couple weeks ago and had discussed increased heart rate with you then.  Said if she needs to come back in here to let her know.

## 2021-06-26 DIAGNOSIS — R000 Tachycardia, unspecified: Secondary | ICD-10-CM | POA: Diagnosis not present

## 2021-06-29 ENCOUNTER — Other Ambulatory Visit: Payer: Self-pay

## 2021-06-29 ENCOUNTER — Ambulatory Visit (INDEPENDENT_AMBULATORY_CARE_PROVIDER_SITE_OTHER): Payer: BC Managed Care – PPO

## 2021-06-29 ENCOUNTER — Encounter: Payer: Self-pay | Admitting: Internal Medicine

## 2021-06-29 ENCOUNTER — Ambulatory Visit (INDEPENDENT_AMBULATORY_CARE_PROVIDER_SITE_OTHER): Payer: BC Managed Care – PPO | Admitting: Internal Medicine

## 2021-06-29 VITALS — BP 96/56 | HR 76 | Ht 64.0 in | Wt 125.4 lb

## 2021-06-29 DIAGNOSIS — R002 Palpitations: Secondary | ICD-10-CM

## 2021-06-29 NOTE — Patient Instructions (Signed)
Medication Instructions:  ?No Changes In Medications at this time.  ?*If you need a refill on your cardiac medications before your next appointment, please call your pharmacy* ? ?Testing/Procedures: ? ?ZIO XT- Long Term Monitor Instructions  ? ?Your physician has requested you wear your ZIO patch monitor___7____days.  ? ?This is a single patch monitor.  [employer] supplies one patch monitor per enrollment.  Additional stickers are not available. ?  ?Please do not apply patch if you will be having a Nuclear Stress Test, Echocardiogram, Cardiac CT, MRI, or Chest Xray during the time frame you would be wearing the monitor. The patch cannot be worn during these tests.  You cannot remove and re-apply the ZIO XT patch monitor. ?  ?Your ZIO patch monitor will be sent USPS Priority mail from [facility] directly to your home address. The monitor may also be mailed to a PO BOX if home delivery is not available.   It may take 3-5 days to receive your monitor after you have been enrolled. ?  ?Once you have received you monitor, please review enclosed instructions.  Your monitor has already been registered assigning a specific monitor serial # to you. ?  ?Applying the monitor  ? ?Shave hair from upper left chest. ?  ?Hold abrader disc by orange tab.  Rub abrader in 40 strokes over left upper chest as indicated in your monitor instructions. ?  ?Clean area with 4 enclosed alcohol pads .  Use all pads to assure are is cleaned thoroughly.  Let dry.  ? ?Apply patch as indicated in monitor instructions.  Patch will be place under collarbone on left side of chest with arrow pointing upward. ?  ?Rub patch adhesive wings for 2 minutes.Remove white label marked "1".  Remove white label marked "2".  Rub patch adhesive wings for 2 additional minutes. ?  ?While looking in a mirror, press and release button in center of patch.  A small green light will flash 3-4 times .  This will be your only indicator the monitor has been turned  on. ?    ?Do not shower for the first 24 hours.  You may shower after the first 24 hours. ?  ?Press button if you feel a symptom. You will hear a small click.  Record Date, Time and Symptom in the Patient Log Book. ?  ?When you are ready to remove patch, follow instructions on last 2 pages of Patient Log Book.  Stick patch monitor onto last page of Patient Log Book. ?  ?Place Patient Log Book in [facility] box.  Use locking tab on box and tape box closed securely.  The [employer] has [employer] on it.  Please place in mailbox as soon as possible.  Your physician should have your test results approximately 7 days after the monitor has been mailed back to [facility]. ?  ?Call [facility] at [phone] if you have questions regarding your ZIO XT patch monitor.  Call them immediately if you see an orange light blinking on your monitor. ?  ?If your monitor falls off in less than 4 days contact our Monitor department at [phone].  If your monitor becomes loose or falls off after 4 days call [employer] at [phone] for suggestions on securing your monitor.  ? ?Follow-Up: ?At [facility], you and your health needs are our priority.  As part of our continuing mission to provide you with exceptional heart care, we have created designated Provider Care Teams.  These Care Teams include your primary Cardiologist (physician) and Advanced Practice Providers (APPs -  Physician Assistants and Nurse Practitioners) who all work together to provide you with the care you need, when you need it. ? ?Your next appointment:   ?AS NEEDED  ? ?The format for your next appointment:   ?In Person ? ?Provider:   ?Janina Mayo, MD   ?

## 2021-06-29 NOTE — Progress Notes (Unsigned)
Enrolled for [employer] to mail a ZIO XT long term holter monitor to the patients address on file.  

## 2021-06-29 NOTE — Progress Notes (Signed)
?Cardiology Office Note:   ? ?Date:  06/29/2021  ? ?ID:  Laura Dudley, DOB 2000/11/24, MRN [id number] ? ?PCP:  Girtha Rm, NP-C ?  ?[facility] Providers ?Cardiologist:  None    ? ?Referring MD: Carlena Hurl, PA-C  ? ?No chief complaint on file. ?High heart rates ? ?History of Present Illness:   ? ?Laura Dudley is a 21 y.o. female with a hx of IBS, has multiple ROS +, anxiety, referred by PA Chana Bode ? ?She notes that on her apple watch that her HR went to 168 on her apple watch. She notes sitting and it was 150.  Notes her mother has diagnosis of SVT. Uncle may have had Mi in his 88s.  No family hx of SCD. No syncopal events. At times feels presyncopal.  ? ?TSH- normal ?Hgb- 13 ?Crt- normal ? ?Past Medical History:  ?Diagnosis Date  ? ADHD 02/17/2020  ? Asthma   ? H/O pyloric stenosis   ? IBS (irritable bowel syndrome)   ? Vitamin D deficiency 12/31/2019  ? ? ?Past Surgical History:  ?Procedure Laterality Date  ? pyloric stenosis repair    ? ? ?Current Medications: ?Current Meds  ?Medication Sig  ? albuterol (VENTOLIN HFA) 108 (90 Base) MCG/ACT inhaler Inhale 2 puffs into the lungs every 6 (six) hours as needed for wheezing or shortness of breath.  ? cetirizine (ZYRTEC) 10 MG tablet Take 10 mg by mouth daily.  ? Cyanocobalamin (VITAMIN B12 PO) Take by mouth daily.  ? dicyclomine (BENTYL) 10 MG capsule Take 1 capsule (10 mg total) by mouth 3 (three) times daily before meals.  ? Multiple Vitamin (MULTI-VITAMIN PO) Take 2 each by mouth.  ? VITAMIN D PO Take by mouth daily.  ? ?Current Facility-Administered Medications for the 06/29/21 encounter (Office Visit) with Janina Mayo, MD  ?Medication  ? 0.9 %  sodium chloride infusion  ?  ? ?Allergies:   Patient has no known allergies.  ? ?Social History  ? ?Socioeconomic History  ? Marital status: Single  ?  Spouse name: Not on file  ? Number of children: Not on file  ? Years of education: Not on file  ? Highest education level: Not on file  ?Occupational  History  ? Not on file  ?Tobacco Use  ? Smoking status: Never  ? Smokeless tobacco: Never  ?Vaping Use  ? Vaping Use: Never used  ?Substance and Sexual Activity  ? Alcohol use: Not Currently  ? Drug use: Yes  ?  Types: Marijuana  ? Sexual activity: Yes  ?  Birth control/protection: OCP  ?Other Topics Concern  ? Not on file  ?Social History Narrative  ? Not on file  ? ?Social Determinants of Health  ? ?Financial Resource Strain: Not on file  ?Food Insecurity: Not on file  ?Transportation Needs: Not on file  ?Physical Activity: Not on file  ?Stress: Not on file  ?Social Connections: Not on file  ?  ? ?Family History: ?The patient's family history includes Asthma in her mother; Bipolar disorder in her maternal grandmother; Depression in her maternal grandmother; Gout in her father; Melanoma in her paternal grandfather. There is no history of Colon cancer or Stomach cancer. ? ?ROS:   ?Please see the history of present illness.    ? All other systems reviewed and are negative. ? ?EKGs/Labs/Other Studies Reviewed:   ? ?The following studies were reviewed today: ? ? ?EKG:  EKG is  ordered today.  The ekg ordered today  demonstrates  ? ?NSR, sinus arrhythmia ? ?Recent Labs: ?11/12/2020: ALT 13; BUN 10; Creatinine, Ser 0.79; Potassium 4.0; Sodium 138 ?06/07/2021: Hemoglobin 13.7; Platelets 218; TSH 0.832  ?Recent Lipid Panel ?No results found for: CHOL, TRIG, HDL, CHOLHDL, VLDL, LDLCALC, LDLDIRECT ? ? ?Risk Assessment/Calculations:   ?  ? ?    ? ?Physical Exam:   ? ?VS:  BP (!) 96/56   Pulse 76   Ht '5\' 4"'$  (1.626 m)   Wt 125 lb 6.4 oz (56.9 kg)   LMP 06/26/2021   SpO2 98%   BMI 21.52 kg/m?    ? ?Wt Readings from Last 3 Encounters:  ?06/29/21 125 lb 6.4 oz (56.9 kg)  ?06/07/21 125 lb 12.8 oz (57.1 kg)  ?12/07/20 130 lb (59 kg)  ?  ? ?GEN:  Well nourished, well developed in no acute distress ?HEENT: Normal ?NECK: No JVD; No carotid bruits ?LYMPHATICS: No lymphadenopathy ?CARDIAC: RRR, no murmurs, rubs, gallops ?RESPIRATORY:   Clear to auscultation without rales, wheezing or rhonchi  ?ABDOMEN: Soft, non-tender, non-distended ?MUSCULOSKELETAL:  No edema; No deformity  ?SKIN: Warm and dry ?NEUROLOGIC:  Alert and oriented x 3 ?PSYCHIATRIC:  Normal affect  ? ?ASSESSMENT:   ? ?1. Palpitations   ?She does not have high risk features including syncope c/f arrhythmia , family hx of SCD, or abnormalities on her EKG. ? ?PLAN:   ? ?In order of problems listed above: ? ?Ziopatch 1 week ?Follow up as needed ? ?   ? ?   ? ? ?Medication Adjustments/Labs and Tests Ordered: ?Current medicines are reviewed at length with the patient today.  Concerns regarding medicines are outlined above.  ?Orders Placed This Encounter  ?Procedures  ? EKG 12-Lead  ? ?No orders of the defined types were placed in this encounter. ? ? ?There are no Patient Instructions on file for this visit.  ? ?Signed, ?Janina Mayo, MD  ?06/29/2021 8:21 AM    ?[place] ?

## 2021-07-01 ENCOUNTER — Telehealth: Payer: Self-pay

## 2021-07-01 NOTE — Telephone Encounter (Signed)
Spoke with patient and advised her of the below. Patient would like the monitor applied using the Skin tac to decrease issue with adhesive on the skin. Advised patient I would forward message over to [facility] to see if patient can get scheduled to have monitor applied at [facility]. Patient verbalized understanding.  ?

## 2021-07-01 NOTE — Telephone Encounter (Signed)
Attempted to call patient, left message for patient to call back to office.   

## 2021-07-01 NOTE — Telephone Encounter (Signed)
-----   Message from Jennefer Bravo sent at 06/29/2021  5:26 PM EDT ----- ?Regarding: RE: Monitor adhesive ?No sorry, but the order was put in specifically for a ZIO XT monitor by [employer].  They do not offer a monitor/patch for sensitive skin.  Patient should receive her monitor in 2-3 days.  If she would like, we could schedule her to come to our [facility] office once she receives her monitor.  When can apply the monitor using Skin Tac, which, may provide a slight barrier between the patch and her skin. ? ?Otherwise they can cancel the Long Term Monitor order for (ZIO), patient can mail back unused monitor once she receives it.  A new order could be placed for a Long Term Monitor from ([employer]), we can specifiy the patient receive a bridge with sensitive skin electrodes, but even this is not a guarantee, she will not have a reaction or irritation. ? ?Please let us know how you decide to proceed. ? ?Thanks,  ?Shelly ?----- Message ----- ?From: Chriss Driver, RN ?Sent: 06/29/2021   2:46 PM EDT ?To: Jennefer Bravo, [place] ?Subject: Monitor adhesive                              ? ?Patient was ordered a 7 day zio today by Dr. Harl Bowie- she said that she has sensitive skin- is there any way to get sensitive skin adhesive? ? ?Thank you  ?Eliezer Lofts b. RN  ? ? ?

## 2021-07-02 ENCOUNTER — Other Ambulatory Visit: Payer: Self-pay

## 2021-07-02 DIAGNOSIS — R002 Palpitations: Secondary | ICD-10-CM | POA: Diagnosis not present

## 2021-07-02 NOTE — Telephone Encounter (Signed)
Patient coming in 3/24 @ 3:00 to have monitor applied ?

## 2021-07-28 DIAGNOSIS — R079 Chest pain, unspecified: Secondary | ICD-10-CM | POA: Diagnosis not present

## 2021-07-28 DIAGNOSIS — R0781 Pleurodynia: Secondary | ICD-10-CM | POA: Diagnosis not present

## 2021-08-12 ENCOUNTER — Encounter: Payer: Self-pay | Admitting: Physician Assistant

## 2021-08-12 ENCOUNTER — Ambulatory Visit (INDEPENDENT_AMBULATORY_CARE_PROVIDER_SITE_OTHER): Payer: BC Managed Care – PPO | Admitting: Physician Assistant

## 2021-08-12 VITALS — BP 110/70 | HR 79 | Ht 64.0 in | Wt 130.6 lb

## 2021-08-12 DIAGNOSIS — M79646 Pain in unspecified finger(s): Secondary | ICD-10-CM

## 2021-08-12 DIAGNOSIS — R208 Other disturbances of skin sensation: Secondary | ICD-10-CM

## 2021-08-12 DIAGNOSIS — R5381 Other malaise: Secondary | ICD-10-CM

## 2021-08-12 DIAGNOSIS — Z6822 Body mass index (BMI) 22.0-22.9, adult: Secondary | ICD-10-CM

## 2021-08-12 DIAGNOSIS — R200 Anesthesia of skin: Secondary | ICD-10-CM

## 2021-08-12 DIAGNOSIS — R202 Paresthesia of skin: Secondary | ICD-10-CM

## 2021-08-12 DIAGNOSIS — R5383 Other fatigue: Secondary | ICD-10-CM

## 2021-08-12 DIAGNOSIS — E559 Vitamin D deficiency, unspecified: Secondary | ICD-10-CM

## 2021-08-12 DIAGNOSIS — M79643 Pain in unspecified hand: Secondary | ICD-10-CM

## 2021-08-12 DIAGNOSIS — D229 Melanocytic nevi, unspecified: Secondary | ICD-10-CM

## 2021-08-12 DIAGNOSIS — I7300 Raynaud's syndrome without gangrene: Secondary | ICD-10-CM

## 2021-08-12 DIAGNOSIS — Z87898 Personal history of other specified conditions: Secondary | ICD-10-CM

## 2021-08-12 DIAGNOSIS — R209 Unspecified disturbances of skin sensation: Secondary | ICD-10-CM

## 2021-08-12 HISTORY — DX: Body mass index (BMI) 22.0-22.9, adult: Z68.22

## 2021-08-12 MED ORDER — METHYLPREDNISOLONE 4 MG PO TBPK: ORAL_TABLET | ORAL | 0 refills | Status: DC

## 2021-08-12 NOTE — Patient Instructions (Signed)
You will get a call to schedule an appointment with Dermatology ?

## 2021-08-12 NOTE — Assessment & Plan Note (Signed)
Stable, vit d level checked today, takes OTC Vitamin D ?

## 2021-08-12 NOTE — Assessment & Plan Note (Signed)
Stable, will monitor 

## 2021-08-12 NOTE — Assessment & Plan Note (Signed)
Ziopatch normal, will monitor ?

## 2021-08-12 NOTE — Assessment & Plan Note (Signed)
Stable, warm hands as needed, CBC checked today ?

## 2021-08-12 NOTE — Progress Notes (Signed)
? ?Acute Office Visit ? ?Subjective:  ? ? Patient ID: Laura Dudley, female    DOB: 02-09-2001, 21 y.o.   MRN: [id number] ? ?Chief Complaint  ?Patient presents with  ? Acute Visit  ?  Face has been burning for a few days. She has not used anything new. She has also been problems with her finger tips getting cold.  ? ? ?HPI ?Patient is in today reporting that her skin is burning on her face, ears, and neck x 2 weeks, worse for 3 days; skin feels hot, like pins and needles, and an uncomfortable irritating feeling that is somewhat painful; has not taken anything OTC for it; states she has sensitive skin; has been using the same moisturizer for a year; has 3 cats that she's had for a while; denies any new skin irritates; denies cough; is also having allergy symptoms of runny nose and slight headache; takes Claritin every day ? ?Also reports that her fingers get very cold, but not sweaty, and look slightly blue few weeks ? ?Also reports blurred vision, went to the eye doctor and was all normal; has Neuro appointment to follow up ? ?Also states she had an evaluation with Cardiology for palpitations and is still having some; per chart review, Ziopatch was normal ? ?Also reports  having moles on her back and requests to go to Dermatology for further evaluation of them  ? ?Outpatient Medications Prior to Visit  ?Medication Sig Dispense Refill  ? albuterol (VENTOLIN HFA) 108 (90 Base) MCG/ACT inhaler Inhale 2 puffs into the lungs every 6 (six) hours as needed for wheezing or shortness of breath. 8 g 0  ? Cyanocobalamin (VITAMIN B12 PO) Take by mouth daily.    ? dicyclomine (BENTYL) 10 MG capsule Take 1 capsule (10 mg total) by mouth 3 (three) times daily before meals. 90 capsule 11  ? Loratadine (CLARITIN) 10 MG CAPS Take by mouth.    ? VITAMIN D PO Take by mouth daily.    ? cetirizine (ZYRTEC) 10 MG tablet Take 10 mg by mouth daily. (Patient not taking: Reported on 08/12/2021)    ? Multiple Vitamin (MULTI-VITAMIN PO) Take 2  each by mouth. (Patient not taking: Reported on 08/12/2021)    ? ?Facility-Administered Medications Prior to Visit  ?Medication Dose Route Frequency Provider Last Rate Last Admin  ? 0.9 %  sodium chloride infusion  500 mL Intravenous Once Ladene Artist, MD      ? ? ?No Known Allergies ? ?Review of Systems  ?Constitutional:  Negative for activity change and chills.  ?HENT:  Positive for postnasal drip and rhinorrhea. Negative for congestion and voice change.   ?Eyes:  Negative for pain and redness.  ?Respiratory:  Negative for cough and wheezing.   ?Cardiovascular:  Positive for palpitations. Negative for chest pain.  ?Gastrointestinal:  Negative for constipation, diarrhea, nausea and vomiting.  ?Endocrine: Negative for polyuria.  ?Genitourinary:  Negative for frequency.  ?Skin:  Positive for color change. Negative for pallor, rash and wound.  ?Allergic/Immunologic: Negative for immunocompromised state.  ?Neurological:  Positive for numbness. Negative for dizziness.  ?Psychiatric/Behavioral:  Negative for agitation.   ? ?   ?Objective:  ?  ?Physical Exam ?Vitals and nursing note reviewed.  ?Constitutional:   ?   General: She is not in acute distress. ?   Appearance: Normal appearance. She is not ill-appearing.  ?HENT:  ?   Head: Normocephalic and atraumatic.  ?   Right Ear: External ear normal.  ?  Left Ear: External ear normal.  ?   Nose: No congestion.  ?Eyes:  ?   Extraocular Movements: Extraocular movements intact.  ?   Conjunctiva/sclera: Conjunctivae normal.  ?   Pupils: Pupils are equal, round, and reactive to light.  ?Cardiovascular:  ?   Rate and Rhythm: Normal rate and regular rhythm.  ?   Pulses: Normal pulses.  ?   Heart sounds: Normal heart sounds.  ?Pulmonary:  ?   Effort: Pulmonary effort is normal.  ?   Breath sounds: Normal breath sounds. No wheezing.  ?Abdominal:  ?   General: Bowel sounds are normal.  ?   Palpations: Abdomen is soft.  ?Musculoskeletal:     ?   General: Normal range of motion.  ?    Right hand: Normal. No swelling. Normal range of motion. Normal pulse.  ?   Left hand: Normal. No swelling. Normal range of motion. Normal pulse.  ?   Cervical back: Normal range of motion and neck supple.  ?   Right lower leg: No edema.  ?   Left lower leg: No edema.  ?Skin: ?   General: Skin is warm and dry.  ?   Coloration: Skin is not pale.  ?   Findings: Lesion present. No bruising, erythema or rash.  ?Neurological:  ?   General: No focal deficit present.  ?   Mental Status: She is alert and oriented to person, place, and time.  ?Psychiatric:     ?   Mood and Affect: Mood normal.     ?   Behavior: Behavior normal.     ?   Thought Content: Thought content normal.  ? ? ?BP 110/70   Pulse 79   Ht '5\' 4"'$  (1.626 m)   Wt 130 lb 9.6 oz (59.2 kg)   LMP 07/26/2021 (Approximate)   SpO2 99%   BMI 22.42 kg/m?  ? ?Wt Readings from Last 3 Encounters:  ?08/12/21 130 lb 9.6 oz (59.2 kg)  ?06/29/21 125 lb 6.4 oz (56.9 kg)  ?06/07/21 125 lb 12.8 oz (57.1 kg)  ? ? ?Results for orders placed or performed in visit on 06/07/21  ?TSH  ?Result Value Ref Range  ? TSH 0.832 0.450 - 4.500 uIU/mL  ?Prolactin  ?Result Value Ref Range  ? Prolactin 10.8 4.8 - 23.3 ng/mL  ?CBC  ?Result Value Ref Range  ? WBC 5.0 3.4 - 10.8 x10E3/uL  ? RBC 4.81 3.77 - 5.28 x10E6/uL  ? Hemoglobin 13.7 11.1 - 15.9 g/dL  ? Hematocrit 41.4 34.0 - 46.6 %  ? MCV 86 79 - 97 fL  ? MCH 28.5 26.6 - 33.0 pg  ? MCHC 33.1 31.5 - 35.7 g/dL  ? RDW 12.0 11.7 - 15.4 %  ? Platelets 218 150 - 450 x10E3/uL  ?T4, free  ?Result Value Ref Range  ? Free T4 1.11 0.82 - 1.77 ng/dL  ?Beta hCG quant (ref lab)  ?Result Value Ref Range  ? hCG Quant <1 mIU/mL  ? ? ?   ?Assessment & Plan:  ?1. Burning sensation of skin ?- CBC with Differential/Platelet ?- Comprehensive metabolic panel ?- TSH + free T4 ?- Vitamin B12 ?- Ambulatory referral to Dermatology ?- Medrol Dosepak ? ?2. Cold extremities ?- CBC with Differential/Platelet ? ?3. Malaise and fatigue ?- CBC with  Differential/Platelet ?- Comprehensive metabolic panel ?- TSH + free T4 ? ?4. Numbness and tingling ?- Vitamin B12 ?- can start OTC Vit B 12 supplements ? ?5. Vitamin D deficiency ?-  VITAMIN D 25 Hydroxy (Vit-D Deficiency, Fractures) ?- continue OTC Vit D supplements ? ?6. Pain in hand and fingers ?- Vitamin B12 ?- VITAMIN D 25 Hydroxy (Vit-D Deficiency, Fractures) ?- can start OTC Vit B 12 supplements ? ?7. Atypical mole ?- Ambulatory referral to Dermatology ?- on lower back ? ?8. History of palpitations ?- stable, has already had evaluation with Cardiology, Ziopatch normal ? ?9. Body mass index (BMI) of 22.0 to 22.9 in adult ? ? ? ?No orders of the defined types were placed in this encounter. ? ? ?Return in about 1 year (around 08/13/2022) for Return for Annual Exam with PCP Jimmye Norman. ? ?Irene Pap, PA-C ?

## 2021-08-13 ENCOUNTER — Other Ambulatory Visit: Payer: Self-pay | Admitting: Physician Assistant

## 2021-08-13 DIAGNOSIS — E559 Vitamin D deficiency, unspecified: Secondary | ICD-10-CM

## 2021-08-13 LAB — CBC WITH DIFFERENTIAL/PLATELET
Basophils Absolute: 0 10*3/uL (ref 0.0–0.2)
Basos: 0 %
EOS (ABSOLUTE): 0 10*3/uL (ref 0.0–0.4)
Eos: 1 %
Hematocrit: 39.6 % (ref 34.0–46.6)
Hemoglobin: 13.1 g/dL (ref 11.1–15.9)
Immature Grans (Abs): 0 10*3/uL (ref 0.0–0.1)
Immature Granulocytes: 0 %
Lymphocytes Absolute: 1 10*3/uL (ref 0.7–3.1)
Lymphs: 24 %
MCH: 29 pg (ref 26.6–33.0)
MCHC: 33.1 g/dL (ref 31.5–35.7)
MCV: 88 fL (ref 79–97)
Monocytes Absolute: 0.3 10*3/uL (ref 0.1–0.9)
Monocytes: 8 %
Neutrophils Absolute: 2.6 10*3/uL (ref 1.4–7.0)
Neutrophils: 67 %
Platelets: 216 10*3/uL (ref 150–450)
RBC: 4.52 x10E6/uL (ref 3.77–5.28)
RDW: 12 % (ref 11.7–15.4)
WBC: 4 10*3/uL (ref 3.4–10.8)

## 2021-08-13 LAB — COMPREHENSIVE METABOLIC PANEL
ALT: 17 IU/L (ref 0–32)
AST: 18 IU/L (ref 0–40)
Albumin/Globulin Ratio: 2 (ref 1.2–2.2)
Albumin: 4.3 g/dL (ref 3.9–5.0)
Alkaline Phosphatase: 86 IU/L (ref 44–121)
BUN/Creatinine Ratio: 13 (ref 9–23)
BUN: 9 mg/dL (ref 6–20)
Bilirubin Total: 0.3 mg/dL (ref 0.0–1.2)
CO2: 22 mmol/L (ref 20–29)
Calcium: 8 mg/dL — ABNORMAL LOW (ref 8.7–10.2)
Chloride: 104 mmol/L (ref 96–106)
Creatinine, Ser: 0.71 mg/dL (ref 0.57–1.00)
Globulin, Total: 2.2 g/dL (ref 1.5–4.5)
Glucose: 82 mg/dL (ref 70–99)
Potassium: 4.2 mmol/L (ref 3.5–5.2)
Sodium: 141 mmol/L (ref 134–144)
Total Protein: 6.5 g/dL (ref 6.0–8.5)
eGFR: 123 mL/min/{1.73_m2} (ref >59)

## 2021-08-13 LAB — TSH+FREE T4
Free T4: 1.14 ng/dL (ref 0.82–1.77)
TSH: 1.15 u[IU]/mL (ref 0.450–4.500)

## 2021-08-13 LAB — VITAMIN B12: Vitamin B-12: 618 pg/mL (ref 232–1245)

## 2021-08-13 LAB — VITAMIN D 25 HYDROXY (VIT D DEFICIENCY, FRACTURES): Vit D, 25-Hydroxy: 22.6 ng/mL — ABNORMAL LOW (ref 30.0–100.0)

## 2021-08-13 MED ORDER — VITAMIN D (ERGOCALCIFEROL) 1.25 MG (50000 UNIT) PO CAPS: 50000.0000 [IU] | ORAL_CAPSULE | ORAL | 3 refills | Status: DC

## 2021-08-18 ENCOUNTER — Ambulatory Visit (INDEPENDENT_AMBULATORY_CARE_PROVIDER_SITE_OTHER): Payer: BC Managed Care – PPO | Admitting: Neurology

## 2021-08-18 ENCOUNTER — Telehealth: Payer: Self-pay | Admitting: Physician Assistant

## 2021-08-18 ENCOUNTER — Encounter: Payer: Self-pay | Admitting: Neurology

## 2021-08-18 VITALS — BP 108/74 | HR 84 | Ht 64.0 in | Wt 131.0 lb

## 2021-08-18 DIAGNOSIS — H539 Unspecified visual disturbance: Secondary | ICD-10-CM

## 2021-08-18 DIAGNOSIS — R519 Headache, unspecified: Secondary | ICD-10-CM | POA: Diagnosis not present

## 2021-08-18 DIAGNOSIS — H538 Other visual disturbances: Secondary | ICD-10-CM

## 2021-08-18 DIAGNOSIS — G43009 Migraine without aura, not intractable, without status migrainosus: Secondary | ICD-10-CM

## 2021-08-18 DIAGNOSIS — G8929 Other chronic pain: Secondary | ICD-10-CM

## 2021-08-18 DIAGNOSIS — R51 Headache with orthostatic component, not elsewhere classified: Secondary | ICD-10-CM

## 2021-08-18 DIAGNOSIS — H5711 Ocular pain, right eye: Secondary | ICD-10-CM

## 2021-08-18 DIAGNOSIS — H5702 Anisocoria: Secondary | ICD-10-CM

## 2021-08-18 MED ORDER — ONDANSETRON 4 MG PO TBDP
4.0000 mg | ORAL_TABLET | Freq: Three times a day (TID) | ORAL | 3 refills | Status: DC | PRN
Start: 2021-08-18 — End: 2023-07-26

## 2021-08-18 MED ORDER — ALPRAZOLAM 0.25 MG PO TABS
ORAL_TABLET | ORAL | 0 refills | Status: DC
Start: 2021-08-18 — End: 2022-04-14

## 2021-08-18 MED ORDER — RIZATRIPTAN BENZOATE 10 MG PO TBDP: 10.0000 mg | ORAL_TABLET | ORAL | 11 refills | Status: DC | PRN

## 2021-08-18 NOTE — Progress Notes (Signed)
?[facility] ? ? ? ?Provider:  Dr Jaynee Eagles ?Requesting Provider: Carlena Hurl, PA-C ?Primary Care Provider:  Irene Pap, PA-C ? ?CC:  blurry vision ? ?HPI:  Laura Dudley is a 21 y.o. female here as requested by Carlena Hurl, PA-C for blurry vision and headaches. PMHx asthma, IBS, ADHD. She has had symptoms for more than 2 years. Has seen eye doctor with normal exam, denies any edema per eye. She has episodes of blurry vision, 5 seconds, a few times a day, blinks eyes and may go away, can last up to 30 seconds, sometimes she is at a computer, sometimes driving, she can still drive and see through it, happens when she is moving it is shorter if just sitting it is shorter. Unsure if the headache is related, sometimes happens with the headache, headaches started years ago but getting recently worse, once a week, right at the inner corner of the eyebrow, pressure, some light and sound sensitivity, nausea, tylenol helps, can last up to hours depends can last up to 10 hours, sleep helps, mother gets migraines. She tries to drink fluids, very active, unknown triggers random (keep a journal), she can wake with headache and they can be worse supine when she wakes up with them, if she wakes with them they are severe, worse with movement, occ nausea, photophobia, a dark room helps.  ? ?Reviewed notes, labs and imaging from outside physicians, which showed: ? ?I reviewed Dr. Madelyn Brunner notes, last seen for multiple symptoms including hot flash type feelings, short of breath, headaches, vomiting, blurred vision, she has been seen before in his office for blurred vision and saw an eye doctor in July 2021 for the same, reportedly there was nothing wrong with her eyes, nevertheless she still gets blurred vision periodically, she has a history of IBS diarrhea, irregular periods, she has a history of several concussions in her lifetime the last may be a year ago when she hit her head on the car door,  she denies any sensory changes. ?  ?Review of Systems: ?Patient complains of symptoms per HPI as well as the following symptoms IBS. Pertinent negatives and positives per HPI. All others negative. ? ? ?Social History  ? ?Socioeconomic History  ? Marital status: Single  ?  Spouse name: Not on file  ? Number of children: Not on file  ? Years of education: Not on file  ? Highest education level: Not on file  ?Occupational History  ? Not on file  ?Tobacco Use  ? Smoking status: Never  ? Smokeless tobacco: Never  ?Vaping Use  ? Vaping Use: Never used  ?Substance and Sexual Activity  ? Alcohol use: Not Currently  ? Drug use: Yes  ?  Types: Marijuana  ? Sexual activity: Yes  ?  Birth control/protection: OCP  ?Other Topics Concern  ? Not on file  ?Social History Narrative  ? Not on file  ? ?Social Determinants of Health  ? ?Financial Resource Strain: Not on file  ?Food Insecurity: Not on file  ?Transportation Needs: Not on file  ?Physical Activity: Not on file  ?Stress: Not on file  ?Social Connections: Not on file  ?Intimate Partner Violence: Not on file  ? ? ?Family History  ?Problem Relation Age of Onset  ? Asthma Mother   ? Migraines Mother   ? Gout Father   ? Bipolar disorder Maternal Grandmother   ? Depression Maternal Grandmother   ? Melanoma Paternal Grandfather   ? Colon cancer  Neg Hx   ? Stomach cancer Neg Hx   ? ? ?Past Medical History:  ?Diagnosis Date  ? ADHD 02/17/2020  ? Asthma   ? H/O pyloric stenosis   ? IBS (irritable bowel syndrome)   ? Vitamin D deficiency 12/31/2019  ? ? ?Patient Active Problem List  ? Diagnosis Date Noted  ? Migraine without aura and without status migrainosus, not intractable 08/18/2021  ? Body mass index (BMI) of 22.0 to 22.9 in adult 08/12/2021  ? History of palpitations 08/12/2021  ? Raynaud's phenomenon without gangrene 08/12/2021  ? Irregular periods 06/07/2021  ? ADHD 02/17/2020  ? Vitamin D deficiency 12/31/2019  ? Bowel habit changes 12/30/2019  ? Menorrhagia with irregular  cycle 12/30/2019  ? Fatigue 12/30/2019  ? Blurry vision, bilateral 12/30/2019  ? Atypical nevus of back 12/30/2019  ? Asthma   ? ? ?Past Surgical History:  ?Procedure Laterality Date  ? pyloric stenosis repair    ? ? ?Current Outpatient Medications  ?Medication Sig Dispense Refill  ? albuterol (VENTOLIN HFA) 108 (90 Base) MCG/ACT inhaler Inhale 2 puffs into the lungs every 6 (six) hours as needed for wheezing or shortness of breath. 8 g 0  ? ALPRAZolam (XANAX) 0.25 MG tablet Take 1-2 tabs (0.'25mg'$ -0.'50mg'$ ) 30-60 minutes before procedure. May repeat if needed.Do not drive. 4 tablet 0  ? Cyanocobalamin (VITAMIN B12 PO) Take by mouth daily.    ? dicyclomine (BENTYL) 10 MG capsule Take 1 capsule (10 mg total) by mouth 3 (three) times daily before meals. 90 capsule 11  ? Loratadine 10 MG CAPS Take by mouth.    ? methylPREDNISolone (MEDROL DOSEPAK) 4 MG TBPK tablet Use as directed 21 tablet 0  ? ondansetron (ZOFRAN-ODT) 4 MG disintegrating tablet Take 1-2 tablets (4-8 mg total) by mouth every 8 (eight) hours as needed. 30 tablet 3  ? rizatriptan (MAXALT-MLT) 10 MG disintegrating tablet Take 1 tablet (10 mg total) by mouth as needed for migraine. May repeat in 2 hours if needed 9 tablet 11  ? VITAMIN D PO Take by mouth daily.    ? Vitamin D, Ergocalciferol, (DRISDOL) 1.25 MG (50000 UNIT) CAPS capsule Take 1 capsule (50,000 Units total) by mouth every 7 (seven) days. 12 capsule 3  ? ?Current Facility-Administered Medications  ?Medication Dose Route Frequency Provider Last Rate Last Admin  ? 0.9 %  sodium chloride infusion  500 mL Intravenous Once Ladene Artist, MD      ? ? ?Allergies as of 08/18/2021  ? (No Known Allergies)  ? ? ?Vitals: ?BP 108/74   Pulse 84   Ht '5\' 4"'$  (1.626 m)   Wt 131 lb (59.4 kg)   LMP 07/26/2021 (Approximate)   BMI 22.49 kg/m?  ?Last Weight:  ?Wt Readings from Last 1 Encounters:  ?08/18/21 131 lb (59.4 kg)  ? ?Last Height:   ?Ht Readings from Last 1 Encounters:  ?08/18/21 '5\' 4"'$  (0.814 m)   ? ? ? ?Physical exam: ?Exam: ?Gen: NAD, conversant, well nourised, well groomed                     ?CV: RRR, no MRG. No Carotid Bruits. No peripheral edema, warm, nontender ?Eyes: Conjunctivae clear without exudates or hemorrhage ? ?Neuro: ?Detailed Neurologic Exam ? ?Speech: ?   Speech is normal; fluent and spontaneous with normal comprehension.  ?Cognition: ?   The patient is oriented to person, place, and time;  ?   recent and remote memory intact;  ?  language fluent;  ?   normal attention, concentration,  ?   fund of knowledge ?Cranial Nerves: ?   The pupils are round, and equally reactive to light but the left pupil may be 61m smaller likely physiologic or could be due to current migraine causing right mydriasis. The fundi are normal and spontaneous venous pulsations are present. Visual fields are full to finger confrontation. Extraocular movements are intact. Trigeminal sensation is intact and the muscles of mastication are normal. The face is symmetric. The palate elevates in the midline. Hearing intact. Voice is normal. Shoulder shrug is normal. The tongue has normal motion without fasciculations.  ? ?Coordination: ?   Normal  ? ?Gait: ?    normal.  ? ?Motor Observation: ?   No asymmetry, no atrophy, and no involuntary movements noted. ?Tone: ?   Normal muscle tone.   ? ?Posture: ?   Posture is normal. normal erect ?   ?Strength: ?   Strength is V/V in the upper and lower limbs.  ?    ?Sensation: intact to LT ?    ?Reflex Exam: ? ?DTR's: ?   Deep tendon reflexes in the upper and lower extremities are normal bilaterally.   ?Toes: ?   The toes are downgoing bilaterally.   ?Clonus: ?   Clonus is absent. ?  ? ?Assessment/Plan:  21y.o. female here as requested by TCarlena Hurl PA-C for blurry vision and headaches. PMHx asthma, IBS, ADHD. ? ?Headaches: keep a journal write down symptoms, fluid intake, foods eaten, weather, allergies, likely migraines it is in a supratrochlear nerve distribution which is  a branch of the trigeminal nerve. Try to treat acutely with triptan like maxalt and ondansetron for nausea. If they get more frequent and the journal is unrevealing would try a preventative ? ?Blurry vision: try usi

## 2021-08-18 NOTE — Telephone Encounter (Signed)
Referral attached to appointment

## 2021-08-18 NOTE — Telephone Encounter (Signed)
Patient is calling for a referral appointment from [facility], [place].  Patient is scheduled for 04/13/2022 at 1:45 with [facility], PA-C. ?

## 2021-08-18 NOTE — Patient Instructions (Addendum)
Headaches: keep a journal write down symptoms, fluid intake, foods eaten, weather, allergies, could be migraines it is in a supratrochlear nerve distribution which is a branch of the trigeminal nerve. Try to treat acutely with triptan like maxalt and ondansetron for nausea. If they get more frequent and the journal is unrevealing would try a preventative next ? ?Blurry vision: try using some eye drops for ry eyes, would still f/u again with eye doctor (had last appointment a year ago) ? ?MRI brain w/wo contrast ? ?Right pupil is about 50m bigger today but equally reactive, likely either physiologic or could Migraine with benign episodic unilateral mydriasis 9having a headache on the right today) but should be evaluated with MRI of the brain, no ptosis ? ?F/u in 3-4 months ? ?Rizatriptan Disintegrating Tablets ?What is this medication? ?RIZATRIPTAN (rye za TRIP tan) treats migraines. It works by blocking pain signals and narrowing blood vessels in the brain. It belongs to a group of medications called triptans. It is not used to prevent migraines. ?This medicine may be used for other purposes; ask your health care provider or pharmacist if you have questions. ?COMMON BRAND NAME(S): Maxalt-MLT ?What should I tell my care team before I take this medication? ?They need to know if you have any of these conditions: ?Cigarette smoker ?Circulation problems in fingers and toes ?Diabetes ?Heart disease ?High blood pressure ?High cholesterol ?History of irregular heartbeat ?History of stroke ?Kidney disease ?Liver disease ?Stomach or intestine problems ?An unusual or allergic reaction to rizatriptan, other medications, foods, dyes, or preservatives ?Pregnant or trying to get pregnant ?Breast-feeding ?How should I use this medication? ?Take this medication by mouth. Follow the directions on the prescription label. Leave the tablet in the sealed blister pack until you are ready to take it. With dry hands, open the blister and  gently remove the tablet. If the tablet breaks or crumbles, throw it away and take a new tablet out of the blister pack. Place the tablet in the mouth and allow it to dissolve, and then swallow. Do not cut, crush, or chew this medication. You do not need water to take this medication. Do not take it more often than directed. ?Talk to your care team regarding the use of this medication in children. While this medication may be prescribed for children as young as 6 years for selected conditions, precautions do apply. ?Overdosage: If you think you have taken too much of this medicine contact a poison control center or emergency room at once. ?NOTE: This medicine is only for you. Do not share this medicine with others. ?What if I miss a dose? ?This does not apply. This medication is not for regular use. ?What may interact with this medication? ?Do not take this medication with any of the following medications: ?Certain medications for migraine headache like almotriptan, eletriptan, frovatriptan, naratriptan, rizatriptan, sumatriptan, zolmitriptan ?Ergot alkaloids like dihydroergotamine, ergonovine, ergotamine, methylergonovine ?MAOIs like Carbex, Eldepryl, Marplan, Nardil, and Parnate ?This medication may also interact with the following medications: ?Certain medications for depression, anxiety, or psychotic disorders ?Propranolol ?This list may not describe all possible interactions. Give your health care provider a list of all the medicines, herbs, non-prescription drugs, or dietary supplements you use. Also tell them if you smoke, drink alcohol, or use illegal drugs. Some items may interact with your medicine. ?What should I watch for while using this medication? ?Visit your care team for regular checks on your progress. Tell your care team if your symptoms do not start to  get better or if they get worse. ?You may get drowsy or dizzy. Do not drive, use machinery, or do anything that needs mental alertness until you  know how this medication affects you. Do not stand up or sit up quickly, especially if you are an older patient. This reduces the risk of dizzy or fainting spells. Alcohol may interfere with the effect of this medication. ?Your mouth may get dry. Chewing sugarless gum or sucking hard candy and drinking plenty of water may help. Contact your care team if the problem does not go away or is severe. ?If you take migraine medications for 10 or more days a month, your migraines may get worse. Keep a diary of headache days and medication use. Contact your care team if your migraine attacks occur more frequently. ?What side effects may I notice from receiving this medication? ?Side effects that you should report to your care team as soon as possible: ?Allergic reactions--skin rash, itching, hives, swelling of the face, lips, tongue, or throat ?Burning, pain, tingling, or color changes in the legs or feet ?Heart attack--pain or tightness in the chest, shoulders, arms, or jaw, nausea, shortness of breath, cold or clammy skin, feeling faint or lightheaded ?Heart rhythm changes--fast or irregular heartbeat, dizziness, feeling faint or lightheaded, chest pain, trouble breathing ?Increase in blood pressure ?Irritability, confusion, fast or irregular heartbeat, muscle stiffness, twitching muscles, sweating, high fever, seizure, chills, vomiting, diarrhea, which may be signs of serotonin syndrome ?Raynaud's--cool, numb, or painful fingers or toes that may change color from pale, to blue, to red ?Seizures ?Stroke--sudden numbness or weakness of the face, arm, or leg, trouble speaking, confusion, trouble walking, loss of balance or coordination, dizziness, severe headache, change in vision ?Sudden or severe stomach pain, nausea, vomiting, fever, or bloody diarrhea ?Vision loss ?Side effects that usually do not require medical attention (report to your care team if they continue or are bothersome): ?Dizziness ?General discomfort or  fatigue ?This list may not describe all possible side effects. Call your doctor for medical advice about side effects. You may report side effects to FDA at 1-800-FDA-1088. ?Where should I keep my medication? ?Keep out of the reach of children and pets. ?Store at room temperature between 15 and 30 degrees C (59 and 86 degrees F). Protect from light and moisture. Throw away any unused medication after the expiration date. ?NOTE: This sheet is a summary. It may not cover all possible information. If you have questions about this medicine, talk to your doctor, pharmacist, or health care provider. ?? 2023 Elsevier/Gold Standard (2020-05-06 00:00:00) ?Ondansetron Dissolving Tablets ?What is this medication? ?ONDANSETRON (on DAN se tron) prevents nausea and vomiting from chemotherapy, radiation, or surgery. It works by blocking substances in the body that may cause nausea or vomiting. It belongs to a group of medications called antiemetics. ?This medicine may be used for other purposes; ask your health care provider or pharmacist if you have questions. ?COMMON BRAND NAME(S): Zofran ODT ?What should I tell my care team before I take this medication? ?They need to know if you have any of these conditions: ?Heart disease ?History of irregular heartbeat ?Liver disease ?Low levels of magnesium or potassium in the blood ?An unusual or allergic reaction to ondansetron, granisetron, other medications, foods, dyes, or preservatives ?Pregnant or trying to get pregnant ?Breast-feeding ?How should I use this medication? ?These tablets are made to dissolve in the mouth. Do not try to push the tablet through the foil backing. With dry hands, peel away  the foil backing and gently remove the tablet. Place the tablet in the mouth and allow it to dissolve, then swallow. While you may take these tablets with water, it is not necessary to do so. ?Talk to your care team regarding the use of this medication in children. Special care may be  needed. ?Overdosage: If you think you have taken too much of this medicine contact a poison control center or emergency room at once. ?NOTE: This medicine is only for you. Do not share this medicine with othe

## 2021-08-19 ENCOUNTER — Encounter: Payer: Self-pay | Admitting: Neurology

## 2021-09-03 ENCOUNTER — Telehealth: Payer: Self-pay | Admitting: Neurology

## 2021-09-03 NOTE — Telephone Encounter (Signed)
45 mins MRI Brain w/wo contrast Dr. Ihor Dow [facility] website scheduled at [facility] 09/14/21 at 10am

## 2021-09-14 ENCOUNTER — Ambulatory Visit (INDEPENDENT_AMBULATORY_CARE_PROVIDER_SITE_OTHER): Payer: BC Managed Care – PPO | Admitting: Physician Assistant

## 2021-09-14 ENCOUNTER — Encounter: Payer: Self-pay | Admitting: Physician Assistant

## 2021-09-14 ENCOUNTER — Ambulatory Visit: Payer: Self-pay

## 2021-09-14 VITALS — BP 100/60 | HR 96 | Ht 64.0 in | Wt 130.2 lb

## 2021-09-14 DIAGNOSIS — K219 Gastro-esophageal reflux disease without esophagitis: Secondary | ICD-10-CM

## 2021-09-14 DIAGNOSIS — R519 Headache, unspecified: Secondary | ICD-10-CM

## 2021-09-14 DIAGNOSIS — R208 Other disturbances of skin sensation: Secondary | ICD-10-CM | POA: Diagnosis not present

## 2021-09-14 DIAGNOSIS — H5711 Ocular pain, right eye: Secondary | ICD-10-CM

## 2021-09-14 DIAGNOSIS — H538 Other visual disturbances: Secondary | ICD-10-CM

## 2021-09-14 DIAGNOSIS — G8929 Other chronic pain: Secondary | ICD-10-CM

## 2021-09-14 DIAGNOSIS — K582 Mixed irritable bowel syndrome: Secondary | ICD-10-CM

## 2021-09-14 DIAGNOSIS — H539 Unspecified visual disturbance: Secondary | ICD-10-CM

## 2021-09-14 DIAGNOSIS — H5702 Anisocoria: Secondary | ICD-10-CM

## 2021-09-14 DIAGNOSIS — R51 Headache with orthostatic component, not elsewhere classified: Secondary | ICD-10-CM

## 2021-09-14 MED ORDER — PANTOPRAZOLE SODIUM 40 MG PO TBEC: 40.0000 mg | DELAYED_RELEASE_TABLET | Freq: Every day | ORAL | 3 refills | Status: DC

## 2021-09-14 MED ORDER — GADOBENATE DIMEGLUMINE 529 MG/ML IV SOLN
10.0000 mL | Freq: Once | INTRAVENOUS | Status: AC | PRN
  Administered 2021-09-14: 10 mL via INTRAVENOUS

## 2021-09-14 NOTE — Patient Instructions (Signed)
You can also take OTC antacids and OTC antigas capsules as needed; avoid stomach irritants like tomatoes, oranges, lemons, limes, spicy foods, greasy foods, alcohol, tobacco products.  You will get a call to schedule an appointment with Dermatology.

## 2021-09-14 NOTE — Progress Notes (Unsigned)
Acute Office Visit  Subjective:    Patient ID: Laura Dudley, female    DOB: 14-Jun-2000, 21 y.o.   MRN: [id number]  Chief Complaint  Patient presents with   Acute Visit    Slight pain in chest after eating. Mainly on the right side    HPI Patient is in today for a 6 day history of right sided chest pain, was persistent for 5 days in a row, but no pain now; pain is worse after eating breakfast sausage, pineapple or cantaloupe; states it feels like an esophageal spasm; drinking water and orange juice do not help it; has not taken any OTC medicine for it like antacids or PPIs; also has a history of IBS and manages the diarrhea and constipation with OTC medicine; had an upper endoscopy 12/07/2020 that showed gastropathy; was prescribed dicyclomine that she takes occasionally that helps IBS, but reports that this pain is different and higher; reports recent stressors of the death of an Uncle and the death anniversary and birthday of another Uncle. Also reports that she is still having the burning sensation of the skin on her face and states her Dermatology appointment isn't until 04/2022.   Outpatient Medications Prior to Visit  Medication Sig Dispense Refill   albuterol (VENTOLIN HFA) 108 (90 Base) MCG/ACT inhaler Inhale 2 puffs into the lungs every 6 (six) hours as needed for wheezing or shortness of breath. 8 g 0   dicyclomine (BENTYL) 10 MG capsule Take 1 capsule (10 mg total) by mouth 3 (three) times daily before meals. 90 capsule 11   Loratadine 10 MG CAPS Take by mouth.     Vitamin D, Ergocalciferol, (DRISDOL) 1.25 MG (50000 UNIT) CAPS capsule Take 1 capsule (50,000 Units total) by mouth every 7 (seven) days. 12 capsule 3   VITAMIN D PO Take by mouth daily.     ALPRAZolam (XANAX) 0.25 MG tablet Take 1-2 tabs (0.$RemoveBef'25mg'ASUTkPbMPS$ -0.$Remov'50mg'GkJDqi$ ) 30-60 minutes before procedure. May repeat if needed.Do not drive. (Patient not taking: Reported on 09/14/2021) 4 tablet 0   Cyanocobalamin (VITAMIN B12 PO) Take by mouth  daily. (Patient not taking: Reported on 09/14/2021)     ondansetron (ZOFRAN-ODT) 4 MG disintegrating tablet Take 1-2 tablets (4-8 mg total) by mouth every 8 (eight) hours as needed. (Patient not taking: Reported on 09/14/2021) 30 tablet 3   rizatriptan (MAXALT-MLT) 10 MG disintegrating tablet Take 1 tablet (10 mg total) by mouth as needed for migraine. May repeat in 2 hours if needed (Patient not taking: Reported on 09/14/2021) 9 tablet 11   methylPREDNISolone (MEDROL DOSEPAK) 4 MG TBPK tablet Use as directed (Patient not taking: Reported on 09/14/2021) 21 tablet 0   Facility-Administered Medications Prior to Visit  Medication Dose Route Frequency Provider Last Rate Last Admin   0.9 %  sodium chloride infusion  500 mL Intravenous Once Ladene Artist, MD        No Known Allergies  Review of Systems     Objective:    Physical Exam  BP 100/60   Pulse 96   Ht $R'5\' 4"'ki$  (1.626 m)   Wt 130 lb 3.2 oz (59.1 kg)   SpO2 97%   BMI 22.35 kg/m   Wt Readings from Last 3 Encounters:  09/14/21 130 lb 3.2 oz (59.1 kg)  08/18/21 131 lb (59.4 kg)  08/12/21 130 lb 9.6 oz (59.2 kg)    Results for orders placed or performed in visit on 08/12/21  CBC with Differential/Platelet  Result Value Ref Range   WBC  4.0 3.4 - 10.8 x10E3/uL   RBC 4.52 3.77 - 5.28 x10E6/uL   Hemoglobin 13.1 11.1 - 15.9 g/dL   Hematocrit 39.6 34.0 - 46.6 %   MCV 88 79 - 97 fL   MCH 29.0 26.6 - 33.0 pg   MCHC 33.1 31.5 - 35.7 g/dL   RDW 12.0 11.7 - 15.4 %   Platelets 216 150 - 450 x10E3/uL   Neutrophils 67 Not Estab. %   Lymphs 24 Not Estab. %   Monocytes 8 Not Estab. %   Eos 1 Not Estab. %   Basos 0 Not Estab. %   Neutrophils Absolute 2.6 1.4 - 7.0 x10E3/uL   Lymphocytes Absolute 1.0 0.7 - 3.1 x10E3/uL   Monocytes Absolute 0.3 0.1 - 0.9 x10E3/uL   EOS (ABSOLUTE) 0.0 0.0 - 0.4 x10E3/uL   Basophils Absolute 0.0 0.0 - 0.2 x10E3/uL   Immature Granulocytes 0 Not Estab. %   Immature Grans (Abs) 0.0 0.0 - 0.1 x10E3/uL   Comprehensive metabolic panel  Result Value Ref Range   Glucose 82 70 - 99 mg/dL   BUN 9 6 - 20 mg/dL   Creatinine, Ser 0.71 0.57 - 1.00 mg/dL   eGFR 123 >59 mL/min/1.73   BUN/Creatinine Ratio 13 9 - 23   Sodium 141 134 - 144 mmol/L   Potassium 4.2 3.5 - 5.2 mmol/L   Chloride 104 96 - 106 mmol/L   CO2 22 20 - 29 mmol/L   Calcium 8.0 (L) 8.7 - 10.2 mg/dL   Total Protein 6.5 6.0 - 8.5 g/dL   Albumin 4.3 3.9 - 5.0 g/dL   Globulin, Total 2.2 1.5 - 4.5 g/dL   Albumin/Globulin Ratio 2.0 1.2 - 2.2   Bilirubin Total 0.3 0.0 - 1.2 mg/dL   Alkaline Phosphatase 86 44 - 121 IU/L   AST 18 0 - 40 IU/L   ALT 17 0 - 32 IU/L  TSH + free T4  Result Value Ref Range   TSH 1.150 0.450 - 4.500 uIU/mL   Free T4 1.14 0.82 - 1.77 ng/dL  Vitamin B12  Result Value Ref Range   Vitamin B-12 618 232 - 1,245 pg/mL  VITAMIN D 25 Hydroxy (Vit-D Deficiency, Fractures)  Result Value Ref Range   Vit D, 25-Hydroxy 22.6 (L) 30.0 - 100.0 ng/mL       Assessment & Plan:  1. GERD without esophagitis  2. Irritable bowel syndrome with both constipation and diarrhea  3. Burning sensation of skin - Ambulatory referral to Dermatology    Meds ordered this encounter  Medications   pantoprazole (PROTONIX) 40 MG tablet    Sig: Take 1 tablet (40 mg total) by mouth daily.    Dispense:  30 tablet    Refill:  3    Order Specific Question:   Supervising Provider    Answer:   Denita Lung [[id number]]    Return for Return as Already Scheduled.  Irene Pap, PA-C

## 2021-09-15 DIAGNOSIS — K219 Gastro-esophageal reflux disease without esophagitis: Secondary | ICD-10-CM | POA: Insufficient documentation

## 2021-09-15 DIAGNOSIS — R208 Other disturbances of skin sensation: Secondary | ICD-10-CM | POA: Insufficient documentation

## 2021-09-15 DIAGNOSIS — K582 Mixed irritable bowel syndrome: Secondary | ICD-10-CM | POA: Insufficient documentation

## 2021-09-15 NOTE — Assessment & Plan Note (Signed)
Stable, follow up with GI prn

## 2021-09-15 NOTE — Assessment & Plan Note (Signed)
Referred to another Dermatologist in hopes of her being scheduled for an appointment before 04/2022

## 2021-09-15 NOTE — Assessment & Plan Note (Signed)
Start Protonix, avoid stomach irritant foods

## 2021-09-21 DIAGNOSIS — R509 Fever, unspecified: Secondary | ICD-10-CM | POA: Diagnosis not present

## 2021-12-15 ENCOUNTER — Encounter: Payer: Self-pay | Admitting: Internal Medicine

## 2021-12-20 ENCOUNTER — Encounter: Payer: Self-pay | Admitting: Adult Health

## 2021-12-20 ENCOUNTER — Ambulatory Visit (INDEPENDENT_AMBULATORY_CARE_PROVIDER_SITE_OTHER): Payer: BC Managed Care – PPO | Admitting: Adult Health

## 2021-12-20 VITALS — BP 108/74 | HR 86 | Ht 64.0 in | Wt 135.4 lb

## 2021-12-20 DIAGNOSIS — G43009 Migraine without aura, not intractable, without status migrainosus: Secondary | ICD-10-CM

## 2021-12-20 DIAGNOSIS — H538 Other visual disturbances: Secondary | ICD-10-CM | POA: Diagnosis not present

## 2021-12-20 NOTE — Progress Notes (Signed)
PATIENT: Laura Dudley DOB: Jul 27, 2000  REASON FOR VISIT: follow up HISTORY FROM: patient PRIMARY NEUROLOGIST: Laura Dudley  Chief Complaint  Patient presents with   Follow-up    [name]  Pt here for migraine f/u Pt states daily she gets lightheadedness,dizziness, and blurry vision       HISTORY OF PRESENT ILLNESS: Today 12/20/21:   [name] is a 21 year old female with a history of blurry vision and migraines.  She returns today for follow-up.  At her last visit with Laura Dudley she was sent for an MRI of the brain which was normal.  She was also given a prescription of rizatriptan to take when she has a migraine.  She reports that still gets blurry vision several times a day in both eyes. She tried eye drops but it did not make a difference. When she gets them she feels like her eyes freeze. She can keep doing whatever she may be doing but she just can't see clearly. Can last 10 seconds to 1 minute.   Reports that she has not had a migraine in over a month. When she does get a migraine she takes rizatriptan- the headache will resolve in 20 minutes.   HISTORY  Laura Dudley is a 21 y.o. female here as requested by Laura Dudley for blurry vision and headaches. PMHx asthma, IBS, ADHD. She has had symptoms for more than 2 years. Has seen eye doctor with normal exam, denies any edema per eye. She has episodes of blurry vision, 5 seconds, a few times a day, blinks eyes and may go away, can last up to 30 seconds, sometimes she is at a computer, sometimes driving, she can still drive and see through it, happens when she is moving it is shorter if just sitting it is shorter. Unsure if the headache is related, sometimes happens with the headache, headaches started years ago but getting recently worse, once a week, right at the inner corner of the eyebrow, pressure, some light and sound sensitivity, nausea, tylenol helps, can last up to hours depends can last up to 10 hours, sleep helps,  mother gets migraines. She tries to drink fluids, very active, unknown triggers random (keep a journal), she can wake with headache and they can be worse supine when she wakes up with them, if she wakes with them they are severe, worse with movement, occ nausea, photophobia, a dark room helps.    Reviewed notes, labs and imaging from outside physicians, which showed:   I reviewed Laura Dudley notes, last seen for multiple symptoms including hot flash type feelings, short of breath, headaches, vomiting, blurred vision, she has been seen before in his office for blurred vision and saw an eye doctor in July 2021 for the same, reportedly there was nothing wrong with her eyes, nevertheless she still gets blurred vision periodically, she has a history of IBS diarrhea, irregular periods, she has a history of several concussions in her lifetime the last may be a year ago when she hit her head on the car door, she denies any sensory changes.  REVIEW OF SYSTEMS: Out of a complete 14 system review of symptoms, the patient complains only of the following symptoms, and all other reviewed systems are negative.  ALLERGIES: No Known Allergies  HOME MEDICATIONS: Outpatient Medications Prior to Visit  Medication Sig Dispense Refill   albuterol (VENTOLIN HFA) 108 (90 Base) MCG/ACT inhaler Inhale 2 puffs into the lungs every 6 (six) hours as needed  for wheezing or shortness of breath. 8 g 0   Cyanocobalamin (VITAMIN B12 PO) Take by mouth as needed.     dicyclomine (BENTYL) 10 MG capsule Take 1 capsule (10 mg total) by mouth 3 (three) times daily before meals. 90 capsule 11   Loratadine 10 MG CAPS Take by mouth.     ondansetron (ZOFRAN-ODT) 4 MG disintegrating tablet Take 1-2 tablets (4-8 mg total) by mouth every 8 (eight) hours as needed. (Patient taking differently: Take 4-8 mg by mouth as needed for nausea.) 30 tablet 3   pantoprazole (PROTONIX) 40 MG tablet Take 1 tablet (40 mg total) by mouth daily. 30 tablet 3    rizatriptan (MAXALT-MLT) 10 MG disintegrating tablet Take 1 tablet (10 mg total) by mouth as needed for migraine. May repeat in 2 hours if needed 9 tablet 11   Vitamin D, Ergocalciferol, (DRISDOL) 1.25 MG (50000 UNIT) CAPS capsule Take 1 capsule (50,000 Units total) by mouth every 7 (seven) days. 12 capsule 3   ALPRAZolam (XANAX) 0.25 MG tablet Take 1-2 tabs (0.'25mg'$ -0.'50mg'$ ) 30-60 minutes before procedure. May repeat if needed.Do not drive. 4 tablet 0   Facility-Administered Medications Prior to Visit  Medication Dose Route Frequency Provider Last Rate Last Admin   0.9 %  sodium chloride infusion  500 mL Intravenous Once Ladene Artist, MD        PAST MEDICAL HISTORY: Past Medical History:  Diagnosis Date   ADHD 02/17/2020   Asthma    H/O pyloric stenosis    IBS (irritable bowel syndrome)    Vitamin D deficiency 12/31/2019    PAST SURGICAL HISTORY: Past Surgical History:  Procedure Laterality Date   pyloric stenosis repair      FAMILY HISTORY: Family History  Problem Relation Age of Onset   Asthma Mother    Migraines Mother    Gout Father    Bipolar disorder Maternal Grandmother    Depression Maternal Grandmother    Melanoma Paternal Grandfather    Colon cancer Neg Hx    Stomach cancer Neg Hx     SOCIAL HISTORY: Social History   Socioeconomic History   Marital status: Single    Spouse name: Not on file   Number of children: Not on file   Years of education: Not on file   Highest education level: Not on file  Occupational History   Not on file  Tobacco Use   Smoking status: Never   Smokeless tobacco: Never  Vaping Use   Vaping Use: Never used  Substance and Sexual Activity   Alcohol use: Not Currently   Drug use: Yes    Types: Marijuana   Sexual activity: Yes    Birth control/protection: OCP  Other Topics Concern   Not on file  Social History Narrative   Not on file   Social Determinants of Health   Financial Resource Strain: Not on file  Food  Insecurity: Not on file  Transportation Needs: Not on file  Physical Activity: Not on file  Stress: Not on file  Social Connections: Not on file  Intimate Partner Violence: Not on file      PHYSICAL EXAM  Vitals:   12/20/21 1015  BP: 108/74  Pulse: 86  Weight: 135 lb 6.4 oz (61.4 kg)  Height: '5\' 4"'$  (1.626 m)   Body mass index is 23.24 kg/m.  Generalized: Well developed, in no acute distress   Neurological examination  Mentation: Alert oriented to time, place, history taking. Follows all commands speech and language  fluent Cranial nerve II-XII: Pupils were equal round reactive to light. Extraocular movements were full, visual field were full on confrontational test. Facial sensation and strength were normal. Head turning and shoulder shrug  were normal and symmetric. Motor: The motor testing reveals 5 over 5 strength of all 4 extremities. Good symmetric motor tone is noted throughout.  Sensory: Sensory testing is intact to soft touch on all 4 extremities. No evidence of extinction is noted.  Coordination: Cerebellar testing reveals good finger-nose-finger and heel-to-shin bilaterally.  Gait and station: Gait is normal.   DIAGNOSTIC DATA (LABS, IMAGING, TESTING) - I reviewed patient records, labs, notes, testing and imaging myself where available.  Lab Results  Component Value Date   WBC 4.0 08/12/2021   HGB 13.1 08/12/2021   HCT 39.6 08/12/2021   MCV 88 08/12/2021   PLT 216 08/12/2021      Component Value Date/Time   NA 141 08/12/2021 0925   K 4.2 08/12/2021 0925   CL 104 08/12/2021 0925   CO2 22 08/12/2021 0925   GLUCOSE 82 08/12/2021 0925   GLUCOSE 89 11/12/2020 0905   BUN 9 08/12/2021 0925   CREATININE 0.71 08/12/2021 0925   CALCIUM 8.0 (L) 08/12/2021 0925   PROT 6.5 08/12/2021 0925   ALBUMIN 4.3 08/12/2021 0925   AST 18 08/12/2021 0925   ALT 17 08/12/2021 0925   ALKPHOS 86 08/12/2021 0925   BILITOT 0.3 08/12/2021 0925   GFRNONAA 121 12/30/2019 1220    GFRAA 139 12/30/2019 1220    Lab Results  Component Value Date   [id number] 697 08/12/2021   Lab Results  Component Value Date   TSH 1.150 08/12/2021      ASSESSMENT AND PLAN 21 y.o. year old female  has a past medical history of ADHD (02/17/2020), Asthma, H/O pyloric stenosis, IBS (irritable bowel syndrome), and Vitamin D deficiency (12/31/2019). here with:  1.  Migraine headache  Continue rizatriptan for abortive therapy  2.  Blurry vision  Not sure the cause of her blurry vision.  MRI was normal.  Advised patient to consider getting second opinion from ophthalmology.  Follow-up with our office in 1 year or sooner if needed     Ward Givens, MSN, NP-C 12/20/2021, 10:40 AM [facility] [address], [place], [place] [address] [phone]

## 2021-12-20 NOTE — Patient Instructions (Signed)
Continue Rizatriptan for migraine Consider second opinion from ophthalmology- Dr. Katy Fitch? If your symptoms worsen or you develop new symptoms please let us know.

## 2022-01-18 ENCOUNTER — Encounter: Payer: Self-pay | Admitting: Internal Medicine

## 2022-01-22 ENCOUNTER — Other Ambulatory Visit: Payer: Self-pay | Admitting: Physician Assistant

## 2022-02-22 ENCOUNTER — Encounter: Payer: Self-pay | Admitting: Internal Medicine

## 2022-03-02 DIAGNOSIS — L821 Other seborrheic keratosis: Secondary | ICD-10-CM | POA: Diagnosis not present

## 2022-03-02 DIAGNOSIS — L309 Dermatitis, unspecified: Secondary | ICD-10-CM | POA: Diagnosis not present

## 2022-03-02 DIAGNOSIS — D225 Melanocytic nevi of trunk: Secondary | ICD-10-CM | POA: Diagnosis not present

## 2022-03-02 DIAGNOSIS — L718 Other rosacea: Secondary | ICD-10-CM | POA: Diagnosis not present

## 2022-03-02 DIAGNOSIS — L814 Other melanin hyperpigmentation: Secondary | ICD-10-CM | POA: Diagnosis not present

## 2022-04-13 ENCOUNTER — Ambulatory Visit: Payer: BC Managed Care – PPO | Admitting: Physician Assistant

## 2022-04-14 ENCOUNTER — Encounter: Payer: Self-pay | Admitting: Nurse Practitioner

## 2022-04-14 ENCOUNTER — Ambulatory Visit (INDEPENDENT_AMBULATORY_CARE_PROVIDER_SITE_OTHER): Payer: BC Managed Care – PPO | Admitting: Nurse Practitioner

## 2022-04-14 VITALS — BP 112/72 | HR 96 | Temp 98.3°F | Wt 132.0 lb

## 2022-04-14 DIAGNOSIS — M2550 Pain in unspecified joint: Secondary | ICD-10-CM | POA: Diagnosis not present

## 2022-04-14 DIAGNOSIS — H538 Other visual disturbances: Secondary | ICD-10-CM

## 2022-04-14 DIAGNOSIS — I7300 Raynaud's syndrome without gangrene: Secondary | ICD-10-CM

## 2022-04-14 DIAGNOSIS — R208 Other disturbances of skin sensation: Secondary | ICD-10-CM

## 2022-04-14 DIAGNOSIS — R0789 Other chest pain: Secondary | ICD-10-CM

## 2022-04-14 DIAGNOSIS — G908 Other disorders of autonomic nervous system: Secondary | ICD-10-CM

## 2022-04-14 DIAGNOSIS — N926 Irregular menstruation, unspecified: Secondary | ICD-10-CM

## 2022-04-14 DIAGNOSIS — J4520 Mild intermittent asthma, uncomplicated: Secondary | ICD-10-CM

## 2022-04-14 DIAGNOSIS — Z87898 Personal history of other specified conditions: Secondary | ICD-10-CM

## 2022-04-14 DIAGNOSIS — R6882 Decreased libido: Secondary | ICD-10-CM

## 2022-04-14 DIAGNOSIS — E559 Vitamin D deficiency, unspecified: Secondary | ICD-10-CM

## 2022-04-14 DIAGNOSIS — R5383 Other fatigue: Secondary | ICD-10-CM | POA: Diagnosis not present

## 2022-04-14 DIAGNOSIS — K582 Mixed irritable bowel syndrome: Secondary | ICD-10-CM

## 2022-04-14 DIAGNOSIS — R232 Flushing: Secondary | ICD-10-CM

## 2022-04-14 MED ORDER — ALBUTEROL SULFATE HFA 108 (90 BASE) MCG/ACT IN AERS: 2.0000 | INHALATION_SPRAY | Freq: Four times a day (QID) | RESPIRATORY_TRACT | 3 refills | Status: AC | PRN

## 2022-04-14 NOTE — Patient Instructions (Addendum)
It was a pleasure seeing you today! Thank you for trusting me with your care.   I want to check your labs to make sure your vitamin D, testosterone, and ANA (this is for lupus and autoimmune concerns).  If all of this is normal, I think you need to see the dysutonomia specialist to see if this is something that could be triggering the symptoms.  I have sent this referral today to go ahead and get this started. If we don't need them, we can always cancel.    If labs were collected today, you will see the results as soon as they become available in [facility]. I will review these labs once I have received all of the results and send you comments and recommendations, if any. If you have specific concerns, we can set up an appointment (virtual or in person) to go over details and come up with a plan together.   If you have received any referrals today, the office where the referral was made will be in contact with you to set up your appointment.   If you have received orders for imaging today, the imaging office will contact you to schedule this. Please note that some imaging requires a prior authorization from insurance, so an order is not a guarantee this will be covered by your insurance, but I want to assure you we will do our best to get this covered and if it is not, you will be notified and we can come up with an alternative plan.   If you take regular prescription medications, please contact your pharmacy for routine refill requests. They will send this directly to [place].  If you were ordered new medication as a part of your examination and treatment today, please contact your pharmacy to determine the status. Many prescriptions require a prior authorization and the pharmacy will contact us to get this started. This may take a few days for approval or denial. If the medication is denied, we will work with you to try alternative medications.   If you have any questions or concerns, please do not  hesitate to contact the office via telephone or [facility].  MyChart messages are received by the [facility] staff during regular business hours Monday through Friday and we do our best to respond in a timely manner.  If your request requires an appointment, the staff will gladly help set that up so that we have the time dedicated to ensure that your questions are appropriately answered.

## 2022-04-14 NOTE — Progress Notes (Signed)
Orma Render, DNP, AGNP-c [facility] [address] [place], [place] [address] [phone]  Subjective:   Laura Dudley is a 22 y.o. female presents to day for evaluation of: Chest pressure Started last year in February while on a walk. She tells me she thought she was having an asthma attack and she used her inhaler and the symptoms got worse. She has also had some pain in the chest on the left side and redness in her face/flushing at that time. She was worked up with cardiology and no concerns were found. Since then, she tells me her face gets hot every day and she will experience periods of redness and warmth. Sometimes it will come and go during the day, other times it is constant. She cannot correlate anything with this happening. The chest pain happen intermittently and is consistent on a daily basis. She also has dizziness with a sensation like she is going to pass out with no known cause, these episodes last only about 30 seconds. She reports episodes of blurred vision where she is unable to focus, joint pain, and GI distress. She tells me she feels like something is not right, but is so frustrated that no one can find a cause. She has been seen by cardiology, neurology, GI, dermatology, ophthalmology and no causes have been found. She tells me she feels like everyone wants to treat the symptoms but no one can seem to find the underlying factor causing the symptoms.  IBS She sees Dr. Fuller Plan for this. She has intermittent flairs that are intermittently well controlled. She is using IB guard and dicyclomine for this and this does help. She feels like the IB guard does a better job for her. She has changed her diet and watches what she eats closely to help prevent flairs.   PMH, Medications, and Allergies reviewed and updated in chart as appropriate.   ROS negative except for what is listed in HPI. Objective:  BP 112/72   Pulse 96   Temp 98.3 F (36.8 C)   Wt 132 lb  (59.9 kg)   LMP 04/14/2022   BMI 22.66 kg/m  Physical Exam Vitals and nursing note reviewed.  Constitutional:      General: She is not in acute distress.    Appearance: Normal appearance.  HENT:     Head: Normocephalic.  Eyes:     Extraocular Movements: Extraocular movements intact.     Conjunctiva/sclera: Conjunctivae normal.     Pupils: Pupils are equal, round, and reactive to light.  Neck:     Vascular: No carotid bruit.  Cardiovascular:     Rate and Rhythm: Normal rate and regular rhythm.     Pulses: Normal pulses.     Heart sounds: Normal heart sounds. No murmur heard. Pulmonary:     Effort: Pulmonary effort is normal.     Breath sounds: Normal breath sounds. No wheezing.  Abdominal:     General: Bowel sounds are normal. There is no distension.     Palpations: Abdomen is soft.     Tenderness: There is no abdominal tenderness. There is no guarding.  Musculoskeletal:        General: Normal range of motion.     Cervical back: Normal range of motion and neck supple.     Right lower leg: No edema.     Left lower leg: No edema.  Lymphadenopathy:     Cervical: No cervical adenopathy.  Skin:    General: Skin is warm and dry.  Capillary Refill: Capillary refill takes less than 2 seconds.  Neurological:     General: No focal deficit present.     Mental Status: She is alert and oriented to person, place, and time.  Psychiatric:        Mood and Affect: Mood normal.        Behavior: Behavior normal.        Thought Content: Thought content normal.        Judgment: Judgment normal.           Assessment & Plan:  Multiple symptoms present with no clear etiology to causative factor. She has had extensive work-up with no clear indicator. I empathize her frustration and fatigue with not feel well and not having answers or a way to help with the symptoms. Her exam was benign today, which is reassuring, but increases the frustration. Some of her symptoms appear to correlate  with possible autoimmune and/or autonomic dysfunction. We discussed these today. Today we will get labs for monitoring for autoimmune factors and send a referral for dysautonomia testing. No alarm sx present today. We will plan to follow-up in 3 months and see how she is doing. We discussed the option of low dose propranolol to see if this was helpful for symptom management, but we will hold off on this until evaluation is complete.  Problem List Items Addressed This Visit     Irritable bowel syndrome with both constipation and diarrhea   Relevant Orders   VITAMIN D 25 Hydroxy (Vit-D Deficiency, Fractures) (Completed)   Testosterone, Free, Total, SHBG (Completed)   ANA w/Reflex if Positive (Completed)   Burning sensation of skin   Relevant Orders   VITAMIN D 25 Hydroxy (Vit-D Deficiency, Fractures) (Completed)   Testosterone, Free, Total, SHBG (Completed)   ANA w/Reflex if Positive (Completed)   Vitamin D deficiency   Relevant Orders   VITAMIN D 25 Hydroxy (Vit-D Deficiency, Fractures) (Completed)   Testosterone, Free, Total, SHBG (Completed)   ANA w/Reflex if Positive (Completed)   History of palpitations   Raynaud's phenomenon without gangrene   Relevant Orders   VITAMIN D 25 Hydroxy (Vit-D Deficiency, Fractures) (Completed)   Testosterone, Free, Total, SHBG (Completed)   ANA w/Reflex if Positive (Completed)   Irregular periods   Asthma   Relevant Medications   albuterol (VENTOLIN HFA) 108 (90 Base) MCG/ACT inhaler   Fatigue - Primary   Relevant Orders   VITAMIN D 25 Hydroxy (Vit-D Deficiency, Fractures) (Completed)   Testosterone, Free, Total, SHBG (Completed)   ANA w/Reflex if Positive (Completed)   Blurry vision, bilateral   Other Visit Diagnoses     Chest heaviness       Relevant Orders   VITAMIN D 25 Hydroxy (Vit-D Deficiency, Fractures) (Completed)   Testosterone, Free, Total, SHBG (Completed)   ANA w/Reflex if Positive (Completed)   Arthralgia, unspecified joint        Relevant Orders   VITAMIN D 25 Hydroxy (Vit-D Deficiency, Fractures) (Completed)   Testosterone, Free, Total, SHBG (Completed)   ANA w/Reflex if Positive (Completed)   Facial flushing       Relevant Orders   VITAMIN D 25 Hydroxy (Vit-D Deficiency, Fractures) (Completed)   Testosterone, Free, Total, SHBG (Completed)   ANA w/Reflex if Positive (Completed)   Low libido       Relevant Orders   VITAMIN D 25 Hydroxy (Vit-D Deficiency, Fractures) (Completed)   Testosterone, Free, Total, SHBG (Completed)   ANA w/Reflex if Positive (Completed)   Dysautonomia-like disorder  Relevant Orders   Ambulatory referral to Cardiology         Orma Render, DNP, AGNP-c 04/15/2022  7:56 AM    History, Medications, Surgery, SDOH, and Family History reviewed and updated as appropriate.

## 2022-04-15 ENCOUNTER — Encounter: Payer: Self-pay | Admitting: Nurse Practitioner

## 2022-04-16 DIAGNOSIS — J029 Acute pharyngitis, unspecified: Secondary | ICD-10-CM | POA: Diagnosis not present

## 2022-04-21 ENCOUNTER — Encounter: Payer: Self-pay | Admitting: Nurse Practitioner

## 2022-04-21 LAB — TESTOSTERONE, FREE, TOTAL, SHBG
Sex Hormone Binding: 87 nmol/L (ref 24.6–122.0)
Testosterone, Free: 0.6 pg/mL (ref 0.0–4.2)
Testosterone: 20 ng/dL (ref 13–71)

## 2022-04-21 LAB — ANA W/REFLEX IF POSITIVE: Anti Nuclear Antibody (ANA): NEGATIVE

## 2022-04-21 LAB — VITAMIN D 25 HYDROXY (VIT D DEFICIENCY, FRACTURES): Vit D, 25-Hydroxy: 25.3 ng/mL — ABNORMAL LOW (ref 30.0–100.0)

## 2022-05-13 IMAGING — CR DG WRIST COMPLETE 3+V*R*
4 series · 4 of 4 positions shown · non-contrast
Comparison: None.

CLINICAL DATA: Right wrist pain for 3-4 months.  No recent injury.

EXAM:
RIGHT WRIST - COMPLETE 3+ VIEW

[x wrist pa right]
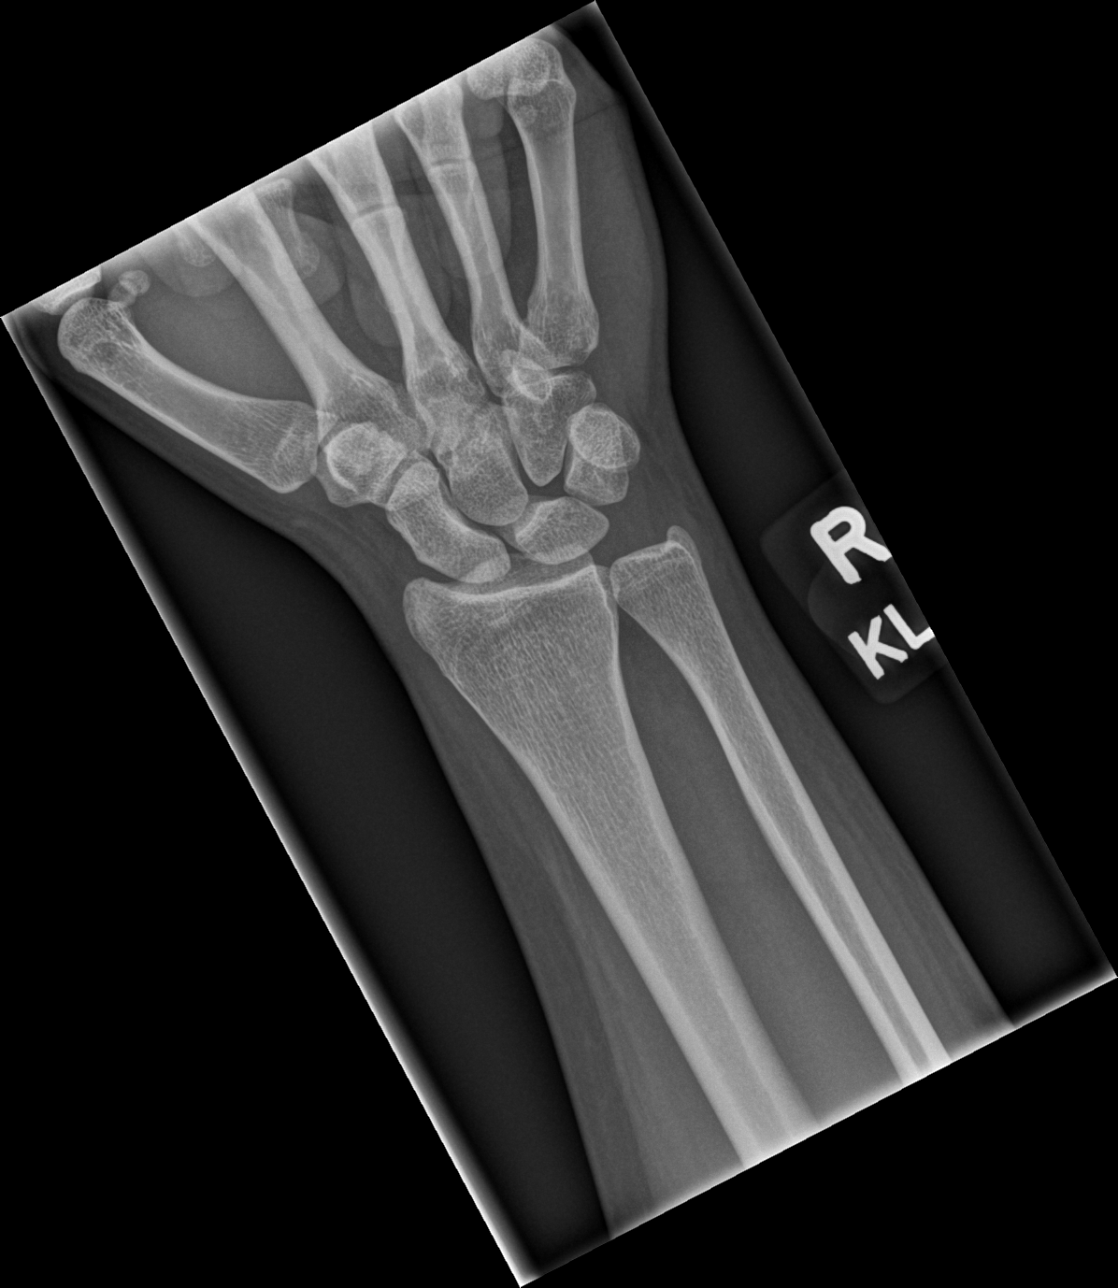

[x wrist obl right]
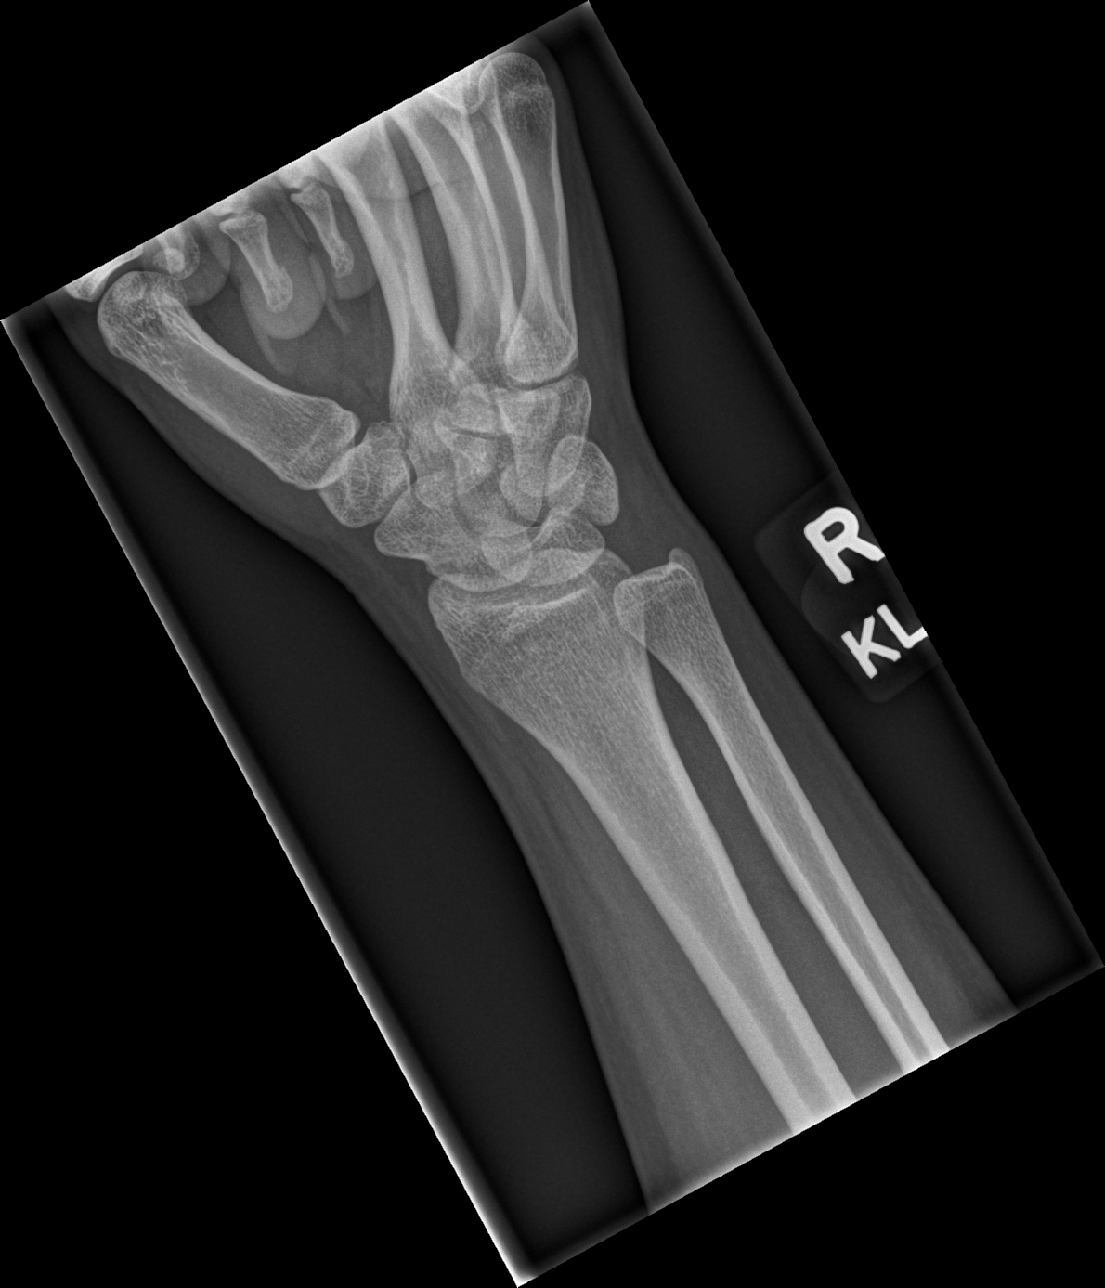

[x wrist lat right]
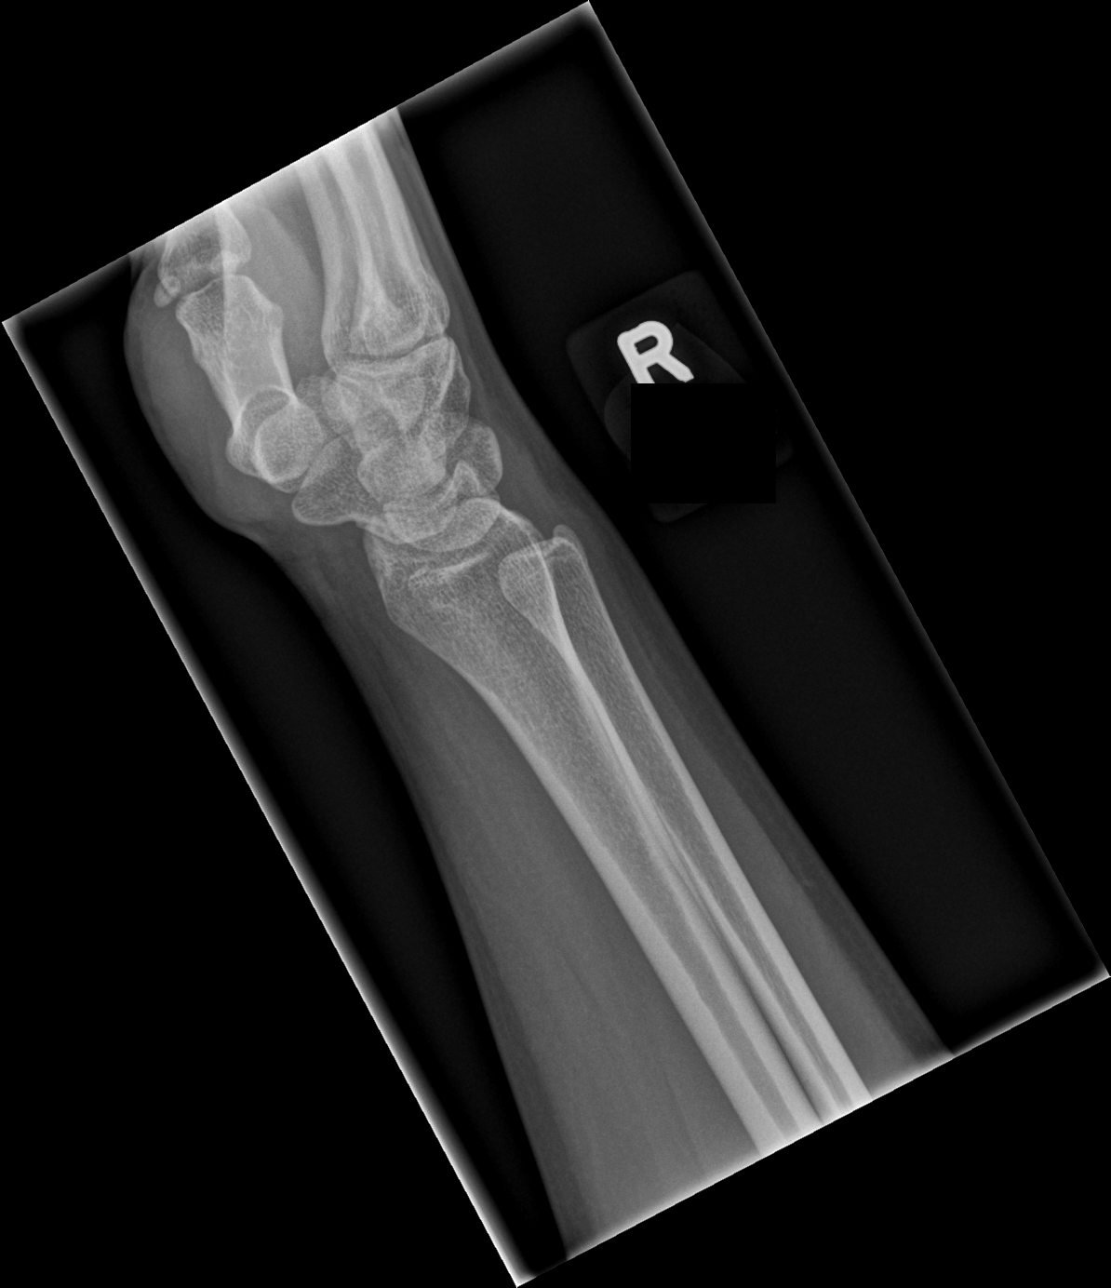

[x wrist navicular view right]
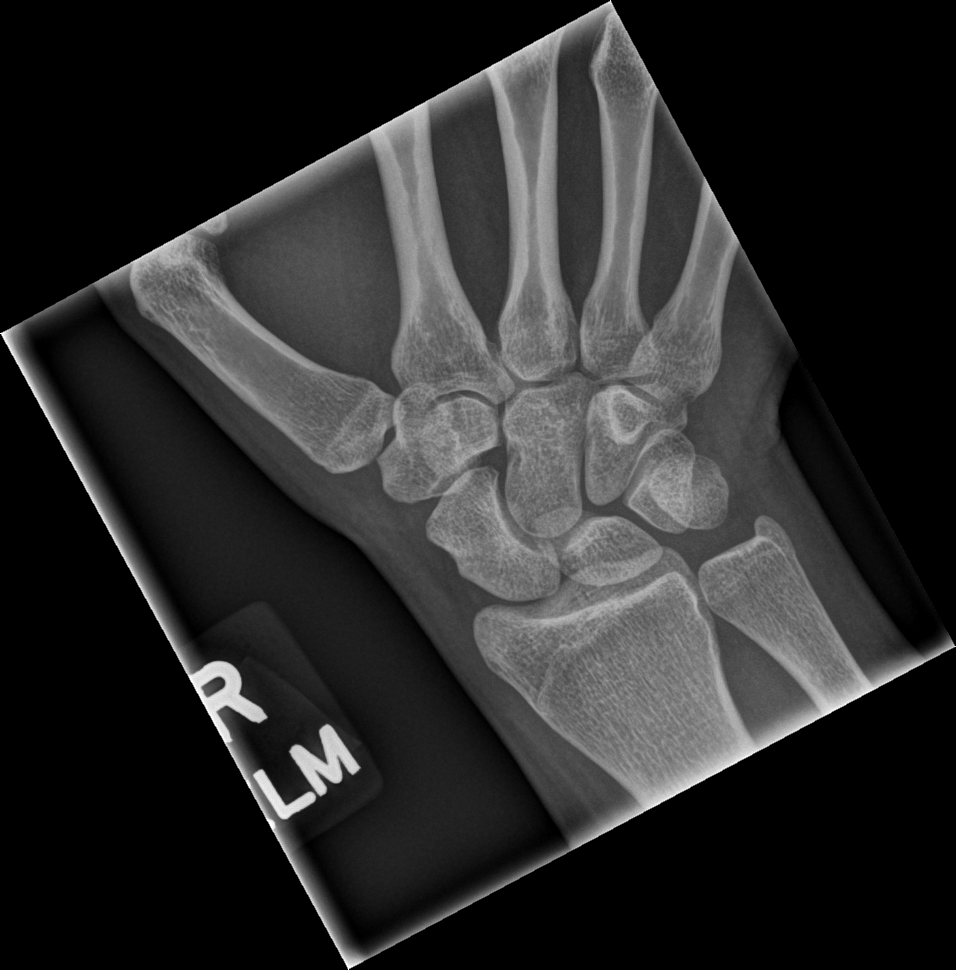

[4 of 4 positions shown; findings below may reference images not displayed]

FINDINGS: There is no evidence of fracture or dislocation. There is no
evidence of arthropathy or other focal bone abnormality. Soft
tissues are unremarkable.
IMPRESSION: Normal exam.

## 2022-05-24 DIAGNOSIS — L218 Other seborrheic dermatitis: Secondary | ICD-10-CM | POA: Diagnosis not present

## 2022-05-24 DIAGNOSIS — L718 Other rosacea: Secondary | ICD-10-CM | POA: Diagnosis not present

## 2022-05-24 DIAGNOSIS — L708 Other acne: Secondary | ICD-10-CM | POA: Diagnosis not present

## 2022-07-04 DIAGNOSIS — S0990XA Unspecified injury of head, initial encounter: Secondary | ICD-10-CM | POA: Diagnosis not present

## 2022-07-25 ENCOUNTER — Ambulatory Visit (INDEPENDENT_AMBULATORY_CARE_PROVIDER_SITE_OTHER): Payer: BC Managed Care – PPO | Admitting: Nurse Practitioner

## 2022-07-25 ENCOUNTER — Encounter: Payer: Self-pay | Admitting: Nurse Practitioner

## 2022-07-25 VITALS — BP 112/70 | HR 98 | Wt 135.6 lb

## 2022-07-25 DIAGNOSIS — J4520 Mild intermittent asthma, uncomplicated: Secondary | ICD-10-CM

## 2022-07-25 DIAGNOSIS — E559 Vitamin D deficiency, unspecified: Secondary | ICD-10-CM

## 2022-07-25 DIAGNOSIS — G43009 Migraine without aura, not intractable, without status migrainosus: Secondary | ICD-10-CM

## 2022-07-25 DIAGNOSIS — K219 Gastro-esophageal reflux disease without esophagitis: Secondary | ICD-10-CM | POA: Diagnosis not present

## 2022-07-25 DIAGNOSIS — R232 Flushing: Secondary | ICD-10-CM

## 2022-07-25 DIAGNOSIS — F900 Attention-deficit hyperactivity disorder, predominantly inattentive type: Secondary | ICD-10-CM

## 2022-07-25 DIAGNOSIS — R1312 Dysphagia, oropharyngeal phase: Secondary | ICD-10-CM | POA: Diagnosis not present

## 2022-07-25 MED ORDER — VITAMIN D3 1.25 MG (50000 UT) PO TABS
1.0000 | ORAL_TABLET | ORAL | 1 refills | Status: DC
Start: 2022-07-25 — End: 2023-01-17

## 2022-07-25 MED ORDER — PANTOPRAZOLE SODIUM 40 MG PO TBEC
40.0000 mg | DELAYED_RELEASE_TABLET | Freq: Every day | ORAL | 3 refills | Status: DC
Start: 2022-07-25 — End: 2022-11-22

## 2022-07-25 NOTE — Patient Instructions (Signed)
I have sent in pantoprazole for you to try to see if you have any improvement in the symptoms. From what I have read it looks like this could be a trigger for many of the symptoms you are experiencing. Give this at least 30 days to see if it helps.

## 2022-07-25 NOTE — Progress Notes (Signed)
Shawna Clamp, DNP, AGNP-c [facility]  [address] [place], [place] [address] [phone]  ESTABLISHED PATIENT- Chronic Health and/or Follow-Up Visit  Blood pressure 112/70, pulse 98, weight 135 lb 9.6 oz (61.5 kg), last menstrual period 07/25/2022.    Laura Dudley is a 22 y.o. year old female presenting today for evaluation and management of chronic conditions.   Adalaya presents today with chief complaints of intermittent chest pain and dysphagia, primarily occurring during dinner. She reports episodes of being unable to swallow both solid food and liquids, lasting for a few minutes. The patient denies any sensation of food remaining in the esophagus after swallowing. She has a history of acid reflux and eosinophilic esophagitis, which was identified during a previous endoscopy. The patient is currently taking Bentyl and IV guard for IBS and acid reflux but has not been taking Pantoprazole previously prescribed for acid reduction.  Alivea also experiences facial flushing, with her face feeling hot while the rest of her body remains cool. The flushing episodes can last from 10 minutes to an hour and are not associated with sweating. She has consulted a dermatologist, who ruled out rosacea as a cause. The patient has not noticed any specific triggers or associations with the flushing episodes.  Kandie has a history of severe menstrual pain and mood changes with menses. She has previously consulted a gynecologist for her menstrual symptoms, but no specific cause was identified to suggest structural abnormality as trigger.  Lucianna reports a history of ADHD and has taken medication for it in the past, but she stopped due to side effects, including weight loss and feeling like a "zombie." She has not been taking any ADHD medication recently.   She mentions a history of concussion a few weeks ago. She denies any concerning symptoms at this time.   All ROS negative with exception  of what is listed above.   PHYSICAL EXAM Physical Exam Vitals and nursing note reviewed.  Constitutional:      General: She is not in acute distress.    Appearance: Normal appearance.  HENT:     Head: Normocephalic.  Eyes:     Extraocular Movements: Extraocular movements intact.     Conjunctiva/sclera: Conjunctivae normal.     Pupils: Pupils are equal, round, and reactive to light.  Neck:     Vascular: No carotid bruit.  Cardiovascular:     Rate and Rhythm: Normal rate and regular rhythm.     Pulses: Normal pulses.     Heart sounds: Normal heart sounds. No murmur heard. Pulmonary:     Effort: Pulmonary effort is normal.     Breath sounds: Normal breath sounds. No wheezing.  Abdominal:     General: Bowel sounds are normal. There is no distension.     Palpations: Abdomen is soft.     Tenderness: There is no abdominal tenderness. There is no guarding.  Musculoskeletal:        General: Normal range of motion.     Cervical back: Normal range of motion and neck supple.     Right lower leg: No edema.     Left lower leg: No edema.  Lymphadenopathy:     Cervical: No cervical adenopathy.  Skin:    General: Skin is warm and dry.     Capillary Refill: Capillary refill takes less than 2 seconds.  Neurological:     General: No focal deficit present.     Mental Status: She is alert and oriented to person, place, and time.  Psychiatric:        Mood and Affect: Mood normal.        Behavior: Behavior normal.     PLAN Problem List Items Addressed This Visit     Asthma    Chronic. Controlled with PRN albuterol. No alarm symptoms present.       Vitamin D deficiency    Repeat labs today      Relevant Medications   Cholecalciferol (VITAMIN D3) 1.25 MG (50000 UT) TABS   ADHD    Patient has a history of ADHD and is currently not taking any medication for it. She has reports of increased fatigue, which is likely due to her cessation of caffeine consumption when she was experiencing  palpitations. Given that her palpitations have resolved, I do feel she can slowly reintroduce small amounts of caffeine back into her regimen.  Plan: - You may cautiously reintroduce caffeine, starting with small amounts and avoiding high-caffeine brands. If you begin to have a return of palpitations or any other concerning symptoms, stop and let me know.  - Consider re-evaluating the need for ADHD medication in the future if symptoms worsen or significantly impact daily functioning.       Migraine without aura and without status migrainosus, not intractable    Patient has a history of migraines accompanied by nausea. Plan: - I have prescribed nausea medication for migraines as needed. - Monitor the frequency and severity of migraines and adjust treatment as necessary.      GERD without esophagitis - Primary    Patient reports difficulty swallowing, mainly at night, and occasional chest pain. History of acid reflux and eosinophilic esophagitis is documented. At this time, her symptoms are consistent with silent reflux. We discussed options for management.  Plan: - I have prescribed Pantoprazole for 30 days to address potential GERD contribution to swallowing difficulties and chest pain. You may stop the medication after 30 days, but if the symptoms return, you may restart this.  - Monitor symptoms and follow up after 30 days if you have not had any relief or sooner if symptoms worsen.      Relevant Medications   pantoprazole (PROTONIX) 40 MG tablet   Facial flushing    Patient experiences episodes of facial flushing without sweating, lasting from 10 minutes to an hour. Examination shows no signs or erythema, warmth, or redness to the face or neck. At this time, the etiology is not clear. No reports of supplements that may contribute to this.  Plan: - Keep a diary of flushing episodes for two weeks, recording associations with food, medication, exertion, stress, and any accompanying  symptoms. - Consider further evaluation if no improvement or worsening of symptoms. - We will see what your labs show to ensure that there is nothing present that could be contributing to the symptoms.       Other Visit Diagnoses     Oropharyngeal dysphagia       Relevant Medications   pantoprazole (PROTONIX) 40 MG tablet       No follow-ups on file.   Shawna Clamp, DNP, AGNP-c 07/25/2022  3:33 PM

## 2022-07-26 ENCOUNTER — Ambulatory Visit: Payer: BC Managed Care – PPO | Admitting: Nurse Practitioner

## 2022-08-01 DIAGNOSIS — R232 Flushing: Secondary | ICD-10-CM | POA: Insufficient documentation

## 2022-08-01 NOTE — Assessment & Plan Note (Signed)
Patient experiences episodes of facial flushing without sweating, lasting from 10 minutes to an hour. Examination shows no signs or erythema, warmth, or redness to the face or neck. At this time, the etiology is not clear. No reports of supplements that may contribute to this.  Plan: - Keep a diary of flushing episodes for two weeks, recording associations with food, medication, exertion, stress, and any accompanying symptoms. - Consider further evaluation if no improvement or worsening of symptoms. - We will see what your labs show to ensure that there is nothing present that could be contributing to the symptoms.

## 2022-08-01 NOTE — Assessment & Plan Note (Signed)
Repeat labs today

## 2022-08-01 NOTE — Assessment & Plan Note (Signed)
Patient has a history of migraines accompanied by nausea. Plan: - I have prescribed nausea medication for migraines as needed. - Monitor the frequency and severity of migraines and adjust treatment as necessary.

## 2022-08-01 NOTE — Assessment & Plan Note (Signed)
Patient reports difficulty swallowing, mainly at night, and occasional chest pain. History of acid reflux and eosinophilic esophagitis is documented. At this time, her symptoms are consistent with silent reflux. We discussed options for management.  Plan: - I have prescribed Pantoprazole for 30 days to address potential GERD contribution to swallowing difficulties and chest pain. You may stop the medication after 30 days, but if the symptoms return, you may restart this.  - Monitor symptoms and follow up after 30 days if you have not had any relief or sooner if symptoms worsen.

## 2022-08-01 NOTE — Assessment & Plan Note (Signed)
Chronic. Controlled with PRN albuterol. No alarm symptoms present.

## 2022-08-01 NOTE — Assessment & Plan Note (Signed)
Patient has a history of ADHD and is currently not taking any medication for it. She has reports of increased fatigue, which is likely due to her cessation of caffeine consumption when she was experiencing palpitations. Given that her palpitations have resolved, I do feel she can slowly reintroduce small amounts of caffeine back into her regimen.  Plan: - You may cautiously reintroduce caffeine, starting with small amounts and avoiding high-caffeine brands. If you begin to have a return of palpitations or any other concerning symptoms, stop and let me know.  - Consider re-evaluating the need for ADHD medication in the future if symptoms worsen or significantly impact daily functioning.

## 2022-08-15 ENCOUNTER — Encounter: Payer: BC Managed Care – PPO | Admitting: Physician Assistant

## 2022-11-04 DIAGNOSIS — R82998 Other abnormal findings in urine: Secondary | ICD-10-CM | POA: Diagnosis not present

## 2022-11-04 DIAGNOSIS — R319 Hematuria, unspecified: Secondary | ICD-10-CM | POA: Diagnosis not present

## 2022-11-04 DIAGNOSIS — R1031 Right lower quadrant pain: Secondary | ICD-10-CM | POA: Diagnosis not present

## 2022-11-04 DIAGNOSIS — R110 Nausea: Secondary | ICD-10-CM | POA: Diagnosis not present

## 2022-11-22 ENCOUNTER — Other Ambulatory Visit: Payer: Self-pay | Admitting: Nurse Practitioner

## 2022-11-22 DIAGNOSIS — R1312 Dysphagia, oropharyngeal phase: Secondary | ICD-10-CM

## 2022-11-22 DIAGNOSIS — K219 Gastro-esophageal reflux disease without esophagitis: Secondary | ICD-10-CM

## 2022-12-20 ENCOUNTER — Telehealth: Payer: Self-pay | Admitting: Neurology

## 2022-12-20 NOTE — Telephone Encounter (Signed)
REQUIRED PHONE NOTE: vm left to cx appointment

## 2022-12-21 ENCOUNTER — Ambulatory Visit: Payer: BC Managed Care – PPO | Admitting: Adult Health

## 2023-01-03 DIAGNOSIS — L2082 Flexural eczema: Secondary | ICD-10-CM | POA: Diagnosis not present

## 2023-01-15 ENCOUNTER — Other Ambulatory Visit: Payer: Self-pay | Admitting: Nurse Practitioner

## 2023-01-15 DIAGNOSIS — E559 Vitamin D deficiency, unspecified: Secondary | ICD-10-CM

## 2023-01-16 NOTE — Telephone Encounter (Signed)
Did you want pt. To remain on this or take OTC daily Vit D.

## 2023-02-21 ENCOUNTER — Other Ambulatory Visit: Payer: Self-pay | Admitting: Nurse Practitioner

## 2023-02-21 DIAGNOSIS — R1312 Dysphagia, oropharyngeal phase: Secondary | ICD-10-CM

## 2023-02-21 DIAGNOSIS — K219 Gastro-esophageal reflux disease without esophagitis: Secondary | ICD-10-CM

## 2023-02-22 ENCOUNTER — Other Ambulatory Visit: Payer: BC Managed Care – PPO

## 2023-02-22 ENCOUNTER — Encounter: Payer: Self-pay | Admitting: Gastroenterology

## 2023-02-22 ENCOUNTER — Ambulatory Visit (INDEPENDENT_AMBULATORY_CARE_PROVIDER_SITE_OTHER): Payer: BC Managed Care – PPO | Admitting: Gastroenterology

## 2023-02-22 VITALS — BP 110/70 | HR 78 | Ht 64.0 in | Wt 147.0 lb

## 2023-02-22 DIAGNOSIS — R194 Change in bowel habit: Secondary | ICD-10-CM | POA: Diagnosis not present

## 2023-02-22 DIAGNOSIS — K582 Mixed irritable bowel syndrome: Secondary | ICD-10-CM

## 2023-02-22 DIAGNOSIS — R109 Unspecified abdominal pain: Secondary | ICD-10-CM

## 2023-02-22 MED ORDER — NA SULFATE-K SULFATE-MG SULF 17.5-3.13-1.6 GM/177ML PO SOLN
1.0000 | Freq: Once | ORAL | 0 refills | Status: AC
Start: 2023-02-22 — End: 2023-02-22

## 2023-02-22 NOTE — Progress Notes (Signed)
Assessment     Change in bowel habits with increased constipation, frequent right-sided abdominal pain, intermittent generalized abdominal pain.  Suspect exacerbation of IBS-A. R/O IBD, colorectal neoplasms.  GERD, well-controlled   Recommendations    TSH, tTG, IgA Miralax qd, titrate dose for complete BMs daily Increase IBgard 1-2 po tid prn. Discontinue dicyclomine  Continue pantoprazole 40 mg daily and follow antireflux measures Schedule colonoscopy. The risks (including bleeding, perforation, infection, missed lesions, medication reactions and possible hospitalization or surgery if complications occur), benefits, and alternatives to colonoscopy with possible biopsy and possible polypectomy were discussed with the patient and they consent to proceed.   REV in 2 months with Dr. Doy Hutching    HPI    This is a 22 year old female who relates worsening problems with frequent right-sided abdominal pain, intermittent generalized abdominal cramping, and a change in bowel habits with increased constipation.  She relates alternating bowel habits with occasional constipation, occasional normal bowel movements and occasional diarrhea.  She noted worsening constipation over the past few months which was exacerbated by dicyclomine so she discontinued it.  Frequent right-sided abdominal pain has been associated with her constipation. Intermittent episodes of more severe generalized abdominal pain and cramping has been associated with bowel movements.  IBgard 1 twice daily has helped her symptoms. She is currently maintaining a gluten-free diet and her symptoms appear to be improved.  She has avoided lactose products and other foods that tend to exacerbate symptoms with some improvement.  No prior colonoscopy.  Her reflux symptoms are well-controlled on daily pantoprazole.  CBC, CMP and lipase unremarkable in July.  Denies weight loss, change in stool caliber, melena, hematochezia, nausea, vomiting,  dysphagia, chest pain.   EGD Aug 2022 - Normal esophagus.  - Erythematous mucosa in the gastric body. Biopsied.  - Normal duodenal bulb and second portion of the duodenum. Biopsied.   Labs / Imaging       Latest Ref Rng & Units 08/12/2021    9:25 AM 11/12/2020    9:05 AM 12/30/2019   12:20 PM  Hepatic Function  Total Protein 6.0 - 8.5 g/dL 6.5  7.1  6.7   Albumin 3.9 - 5.0 g/dL 4.3  4.3  4.3   AST 0 - 40 IU/L 18  14  14    ALT 0 - 32 IU/L 17  13  15    Alk Phosphatase 44 - 121 IU/L 86  60  91   Total Bilirubin 0.0 - 1.2 mg/dL 0.3  0.6  0.3        Latest Ref Rng & Units 08/12/2021    9:25 AM 06/07/2021    3:24 PM 11/12/2020    9:05 AM  CBC  WBC 3.4 - 10.8 x10E3/uL 4.0  5.0  4.4   Hemoglobin 11.1 - 15.9 g/dL 16.1  09.6  04.5   Hematocrit 34.0 - 46.6 % 39.6  41.4  38.1   Platelets 150 - 450 x10E3/uL 216  218  197.0    Current Medications, Allergies, Past Medical History, Past Surgical History, Family History and Social History were reviewed in [facility] record.   Physical Exam: General: Well developed, well nourished, no acute distress Head: Normocephalic and atraumatic Eyes: Sclerae anicteric, EOMI Ears: Normal auditory acuity Mouth: No deformities or lesions noted Lungs: Clear throughout to auscultation Heart: Regular rate and rhythm; No murmurs, rubs or bruits Abdomen: Soft, mild right sided abdominal tenderness and non distended. No masses, hepatosplenomegaly or hernias noted. Normal Bowel  sounds Rectal: Not done Musculoskeletal: Symmetrical with no gross deformities  Pulses:  Normal pulses noted Extremities: No edema or deformities noted Neurological: Alert oriented x 4, grossly nonfocal Psychological:  Alert and cooperative. Normal mood and affect   Laura Dudley T. Laura Dar, MD 02/22/2023, 9:40 AM

## 2023-02-22 NOTE — Patient Instructions (Signed)
Your provider has requested that you go to the basement level for lab work before leaving today. Press "B" on the elevator. The lab is located at the first door on the left as you exit the elevator.  Please purchase the following medications over the counter and take as directed: Miralax daily and Ibgard 1-2 capsules by mouth three times a day.   You have been scheduled for a colonoscopy. Please follow written instructions given to you at your visit today.   Please pick up your prep supplies at the pharmacy within the next 1-3 days.  If you use inhalers (even only as needed), please bring them with you on the day of your procedure.  DO NOT TAKE 7 DAYS PRIOR TO TEST- Trulicity (dulaglutide) Ozempic, Wegovy (semaglutide) Mounjaro (tirzepatide) Bydureon Bcise (exanatide extended release)  DO NOT TAKE 1 DAY PRIOR TO YOUR TEST Rybelsus (semaglutide) Adlyxin (lixisenatide) Victoza (liraglutide) Byetta (exanatide) ___________________________________________________________________________ Due to recent changes in healthcare laws, you may see the results of your imaging and laboratory studies on MyChart before your provider has had a chance to review them.  We understand that in some cases there may be results that are confusing or concerning to you. Not all laboratory results come back in the same time frame and the provider may be waiting for multiple results in order to interpret others.  Please give [place] 48 hours in order for your provider to thoroughly review all the results before contacting the office for clarification of your results.   The [facility] providers would like to encourage you to use [facility] to communicate with providers for non-urgent requests or questions.  Due to long hold times on the telephone, sending your provider a message by [facility] may be a faster and more efficient way to get a response.  Please allow 48 business hours for a response.  Please remember that this is for  non-urgent requests.   Thank you for choosing me and [facility].  Venita Lick. Pleas Koch., MD., Clementeen Graham

## 2023-02-23 LAB — TISSUE TRANSGLUTAMINASE, IGA: (tTG) Ab, IgA: <1 U/mL

## 2023-02-23 LAB — IGA: Immunoglobulin A: 245 mg/dL (ref 47–310)

## 2023-03-07 DIAGNOSIS — D225 Melanocytic nevi of trunk: Secondary | ICD-10-CM | POA: Diagnosis not present

## 2023-03-07 DIAGNOSIS — L814 Other melanin hyperpigmentation: Secondary | ICD-10-CM | POA: Diagnosis not present

## 2023-03-07 DIAGNOSIS — L718 Other rosacea: Secondary | ICD-10-CM | POA: Diagnosis not present

## 2023-03-07 DIAGNOSIS — L708 Other acne: Secondary | ICD-10-CM | POA: Diagnosis not present

## 2023-03-23 ENCOUNTER — Encounter: Payer: BC Managed Care – PPO | Admitting: Gastroenterology

## 2023-04-17 DIAGNOSIS — J029 Acute pharyngitis, unspecified: Secondary | ICD-10-CM | POA: Diagnosis not present

## 2023-04-25 DIAGNOSIS — J029 Acute pharyngitis, unspecified: Secondary | ICD-10-CM | POA: Diagnosis not present

## 2023-04-25 DIAGNOSIS — J069 Acute upper respiratory infection, unspecified: Secondary | ICD-10-CM | POA: Diagnosis not present

## 2023-04-25 DIAGNOSIS — B9689 Other specified bacterial agents as the cause of diseases classified elsewhere: Secondary | ICD-10-CM | POA: Diagnosis not present

## 2023-04-25 DIAGNOSIS — R509 Fever, unspecified: Secondary | ICD-10-CM | POA: Diagnosis not present

## 2023-04-25 DIAGNOSIS — R059 Cough, unspecified: Secondary | ICD-10-CM | POA: Diagnosis not present

## 2023-04-26 NOTE — Progress Notes (Signed)
[facility] Return Visit   Referring Provider Early, Sung Amabile, NP [address] [place],  [place] [address]  Primary Care Provider Early, Sung Amabile, NP  Patient Profile: Laura Dudley is a 23 y.o. female who returns to the [facility] for follow-up of the problem(s) noted below.  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 Boisselle was last seen in the GI office 02/22/2023 by Dr. Russella Dar   Current GI Meds  MiraLAX Ibgard Pantoprazole 40 mg orally daily Ondansetron 4 mg tablet every 8 hours as needed nausea  Interval History  Laura Dudley returns to the office today reporting ongoing symptoms with right-sided abdominal pain as well as alternating diarrhea and constipation and GERD  IBS-mixed with diarrhea and constipation Pain seems to be predominant symptom and has been present for many years She describes her pain is typically right-sided but can occur abruptly in her back when she has flares of IBS When she has what she describes as a "flareup" she will experience diarrhea and cramping Reports that she is typically having 3-5 bowel movements a day but they can vary in terms of consistency and size Sometimes she does report feeling constipated when she is only expelling a small bowel movement No blood or mucus in her stool  She has trialed dicyclomine which alleviated pain but resulted in constipation -she attempted dose titration and reduction IBgard has been helpful Notes that she has experienced stress related to work which could contribute to some of her symptoms Recently became engaged which she is excited about and is a source of "good stress" She monitors her diet and notes that she has a sensitivity to gluten, lactose and ketchup Celiac panel at the time of her last visit was negative although she notes she was gluten free at the time  Notes  intermittent symptoms of migraine headaches and lightheadedness She has had a history of occasional racing heart but no syncope Reports she had a heart monitor test within the last year that did not show abnormalities No documented history of POTS  GERD Laura Dudley reports a history of GERD/indigestion type symptoms for which she takes pantoprazole 40 mg orally daily GERD symptoms are currently well-controlled No nausea, vomiting, dysphagia or odynophagia  Last colonoscopy: None Last endoscopy:  11/2020 - erythematous mucosa in the gastric body, otherwise normal  Last Abd CT/CTE/MRE: None  GI Review of Symptoms Significant for abdominal pain, constipation, diarrhea. Otherwise negative.  General Review of Systems  Review of systems is significant for the pertinent positives and negatives as listed per the HPI.  Full ROS is otherwise negative.  Past Medical History   Past Medical History:  Diagnosis Date   ADHD 02/17/2020   Asthma    H/O pyloric stenosis    IBS (irritable bowel syndrome)    Vitamin D deficiency 12/31/2019     Past Surgical History   Past Surgical History:  Procedure Laterality Date   pyloric stenosis repair       Allergies and Medications  No Known Allergies  Current Meds  Medication Sig   albuterol (VENTOLIN HFA) 108 (90 Base) MCG/ACT inhaler Inhale 2 puffs into the lungs every 6 (six) hours as needed for wheezing or shortness of breath.   amitriptyline (ELAVIL) 10 MG tablet Take 1 tablet (10 mg total) by mouth at bedtime.   Cholecalciferol (VITAMIN D3) 1.25 MG (50000 UT) CAPS TAKE 1 CAPSULE BY MOUTH  ONE TIME PER WEEK   loratadine (CLARITIN) 10 MG tablet Take 10 mg by mouth daily.   ondansetron (ZOFRAN-ODT) 4 MG disintegrating tablet Take 1-2 tablets (4-8 mg total) by mouth every 8 (eight) hours as needed.   pantoprazole (PROTONIX) 40 MG tablet TAKE 1 TABLET BY MOUTH EVERY DAY   rizatriptan (MAXALT-MLT) 10 MG disintegrating tablet Take 1 tablet (10 mg total)  by mouth as needed for migraine. May repeat in 2 hours if needed   Current Facility-Administered Medications for the 04/27/23 encounter (Office Visit) with Ottie Glazier, MD  Medication   0.9 %  sodium chloride infusion     Family History   Family History  Problem Relation Age of Onset   Asthma Mother    Migraines Mother    Gout Father    Bipolar disorder Maternal Grandmother    Depression Maternal Grandmother    Melanoma Paternal Grandfather    Colon cancer Neg Hx    Stomach cancer Neg Hx     Social History   Social History   Tobacco Use   Smoking status: Never   Smokeless tobacco: Never  Vaping Use   Vaping status: Never Used  Substance Use Topics   Alcohol use: Not Currently   Drug use: Yes    Types: Marijuana   Laura Dudley reports that she has never smoked. She has never used smokeless tobacco. She reports that she does not currently use alcohol. She reports current drug use. Drug: Marijuana.  Vital Signs and Physical Examination   Vitals:   04/27/23 0819  BP: 110/70  Pulse: 100  Height: 5\' 4"  (1.626 m)  Weight: 144 lb (65.3 kg)  BMI (Calculated): 24.71   General: Well developed, well nourished, no acute distress Head: Normocephalic and atraumatic Eyes: Sclerae anicteric, Ears: Normal auditory acuity Mouth: No deformities or lesions noted Lungs: Clear throughout to auscultation Heart: Regular rate and rhythm; No murmurs, rubs or bruits Abdomen: Soft, tender to palpation over right and mid abdomen non distended.  No rebound or guarding no masses, hepatosplenomegaly or hernias noted. Normal Bowel sounds Rectal: Deferred Musculoskeletal: Symmetrical with no gross deformities  Pulses:  Normal pulses noted Extremities: No edema or deformities noted Neurological: Alert oriented x 4, grossly nonfocal Psychological:  Alert and cooperative. Normal mood and affect   Review of Data  The following data was reviewed at the time of this encounter:  Laboratory  Studies      Latest Ref Rng & Units 08/12/2021    9:25 AM 06/07/2021    3:24 PM 11/12/2020    9:05 AM  CBC  WBC 3.4 - 10.8 x10E3/uL 4.0  5.0  4.4   Hemoglobin 11.1 - 15.9 g/dL 16.1  09.6  04.5   Hematocrit 34.0 - 46.6 % 39.6  41.4  38.1   Platelets 150 - 450 x10E3/uL 216  218  197.0     Lab Results  Component Value Date   LIPASE 33.0 11/12/2020      Latest Ref Rng & Units 08/12/2021    9:25 AM 11/12/2020    9:05 AM 12/30/2019   12:20 PM  CMP  Glucose 70 - 99 mg/dL 82  89  79   BUN 6 - 20 mg/dL 9  10  7    Creatinine 0.57 - 1.00 mg/dL 4.09  8.11  9.14   Sodium 134 - 144 mmol/L 141  138  138   Potassium 3.5 - 5.2 mmol/L 4.2  4.0  4.0   Chloride 96 - 106  mmol/L 104  105  103   CO2 20 - 29 mmol/L 22  23  22    Calcium 8.7 - 10.2 mg/dL 8.0  9.1  9.3   Total Protein 6.0 - 8.5 g/dL 6.5  7.1  6.7   Total Bilirubin 0.0 - 1.2 mg/dL 0.3  0.6  0.3   Alkaline Phos 44 - 121 IU/L 86  60  91   AST 0 - 40 IU/L 18  14  14    ALT 0 - 32 IU/L 17  13  15     02/2023 - normal celiac panel  EKG 06/2021-normal sinus rhythm with sinus arrhythmia  Imaging Studies  RUQ [place] 11/2020 - normal  GI Procedures and Studies  11/2020 - erythematous mucosa in the gastric body, otherwise normal   Clinical Impression  It is my clinical impression that Ms. Penuelas is a 23 y.o. 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  Previous evaluation has included laboratory testing, abdominal ultrasound and upper endoscopy which have been unremarkable.  She has noted sensitivities to gluten and lactose which she limits in her diet.  Based upon her history and normal objective evaluation I do feel that her constellation of symptoms are most consistent with a form of irritable bowel.  Her subtype it appears to be mixed in nature with intermittent constipation and  diarrhea.  Her predominant symptom is pain.  Her abdominal pain improved with the use of dicyclomine but this unfortunately resulted in constipation.  She also has a pertinent history of migraine headaches.  We discussed the possibility of trialing amitriptyline given pain predominant IBS and history of migraines.  Reviewed potential side effects of sedation, dry eyes, dry mouth, tachycardia and arrhythmia.  EKG in 2023 showed a normal QT.  Advised that she can continue taking dicyclomine as needed in conjunction with amitriptyline but to do so cautiously due to possible side effects.  Plan  Start amitriptyline 10 mg p.o. nightly.  Advised she contact our office in 2 to 4 weeks to report on her symptoms.  If pain is not improving can do is titrate to 25 mg p.o. nightly. Continue IBgard as needed Continue dicyclomine as needed Continue pantoprazole 40 mg orally daily 30 minutes before meal and lifestyle modification Continue limiting gluten and lactose in diet Monitor weight and anthropometrics  Planned Follow Up 2-4 months  The patient or caregiver verbalized understanding of the material covered, with no barriers to understanding. All questions were answered. Patient or caregiver is agreeable with the plan outlined above.    It was a pleasure to see Laura Dudley.  If you have any questions or concerns regarding this evaluation, do not hesitate to contact me.  Maren Beach, MD [facility]   I spent total of  30 minutes in both face-to-face and non-face-to-face activities, excluding procedures performed, for the visit on the date of this encounter.

## 2023-04-27 ENCOUNTER — Ambulatory Visit: Payer: BC Managed Care – PPO | Admitting: Pediatrics

## 2023-04-27 ENCOUNTER — Encounter: Payer: Self-pay | Admitting: Pediatrics

## 2023-04-27 VITALS — BP 110/70 | HR 100 | Ht 64.0 in | Wt 144.0 lb

## 2023-04-27 DIAGNOSIS — K9041 Non-celiac gluten sensitivity: Secondary | ICD-10-CM

## 2023-04-27 DIAGNOSIS — E739 Lactose intolerance, unspecified: Secondary | ICD-10-CM

## 2023-04-27 DIAGNOSIS — K219 Gastro-esophageal reflux disease without esophagitis: Secondary | ICD-10-CM

## 2023-04-27 DIAGNOSIS — K582 Mixed irritable bowel syndrome: Secondary | ICD-10-CM

## 2023-04-27 MED ORDER — AMITRIPTYLINE HCL 10 MG PO TABS: 10.0000 mg | ORAL_TABLET | Freq: Every day | ORAL | 1 refills | Status: DC

## 2023-04-27 NOTE — Patient Instructions (Addendum)
_______________________________________________________  If your blood pressure at your visit was 140/90 or greater, please contact your primary care physician to follow up on this.  If you are age 24 or younger, your body mass index should be between 19-25. Your Body mass index is 24.72 kg/m. If this is out of the aformentioned range listed, please consider follow up with your Primary Care Provider.  ________________________________________________________  The [facility] providers would like to encourage you to use [facility] to communicate with providers for non-urgent requests or questions.  Due to long hold times on the telephone, sending your provider a message by [facility] may be a faster and more efficient way to get a response.  Please allow 48 business hours for a response.  Please remember that this is for non-urgent requests.  _______________________________________________________  We have sent the following medications to your pharmacy for you to pick up at your convenience:  START: Amitriptyline 10mg  one tablet by mouth at bedtime each night  Thank you for entrusting me with your care and choosing [facility].  Dr Doy Hutching

## 2023-05-17 DIAGNOSIS — R1032 Left lower quadrant pain: Secondary | ICD-10-CM | POA: Diagnosis not present

## 2023-05-17 DIAGNOSIS — N760 Acute vaginitis: Secondary | ICD-10-CM | POA: Diagnosis not present

## 2023-05-31 ENCOUNTER — Other Ambulatory Visit: Payer: Self-pay | Admitting: Nurse Practitioner

## 2023-05-31 DIAGNOSIS — R1312 Dysphagia, oropharyngeal phase: Secondary | ICD-10-CM

## 2023-05-31 DIAGNOSIS — K219 Gastro-esophageal reflux disease without esophagitis: Secondary | ICD-10-CM

## 2023-06-28 ENCOUNTER — Other Ambulatory Visit: Payer: Self-pay | Admitting: Nurse Practitioner

## 2023-06-28 DIAGNOSIS — K219 Gastro-esophageal reflux disease without esophagitis: Secondary | ICD-10-CM

## 2023-06-28 DIAGNOSIS — R1312 Dysphagia, oropharyngeal phase: Secondary | ICD-10-CM

## 2023-07-06 ENCOUNTER — Ambulatory Visit (INDEPENDENT_AMBULATORY_CARE_PROVIDER_SITE_OTHER): Admitting: Nurse Practitioner

## 2023-07-06 VITALS — BP 118/74 | HR 72 | Wt 147.2 lb

## 2023-07-06 DIAGNOSIS — K120 Recurrent oral aphthae: Secondary | ICD-10-CM | POA: Diagnosis not present

## 2023-07-06 DIAGNOSIS — H66002 Acute suppurative otitis media without spontaneous rupture of ear drum, left ear: Secondary | ICD-10-CM

## 2023-07-06 DIAGNOSIS — K219 Gastro-esophageal reflux disease without esophagitis: Secondary | ICD-10-CM

## 2023-07-06 DIAGNOSIS — J4520 Mild intermittent asthma, uncomplicated: Secondary | ICD-10-CM | POA: Diagnosis not present

## 2023-07-06 DIAGNOSIS — G9089 Other disorders of autonomic nervous system: Secondary | ICD-10-CM | POA: Diagnosis not present

## 2023-07-06 MED ORDER — AIRSUPRA 90-80 MCG/ACT IN AERO: 2.0000 | INHALATION_SPRAY | Freq: Four times a day (QID) | RESPIRATORY_TRACT | 3 refills | Status: AC | PRN

## 2023-07-06 MED ORDER — AMOXICILLIN-POT CLAVULANATE 875-125 MG PO TABS: 1.0000 | ORAL_TABLET | Freq: Two times a day (BID) | ORAL | 0 refills | Status: DC

## 2023-07-06 MED ORDER — FLUCONAZOLE 150 MG PO TABS: ORAL_TABLET | ORAL | 2 refills | Status: DC

## 2023-07-06 MED ORDER — DEXAMETHASONE 0.5 MG/5ML PO ELIX: ORAL_SOLUTION | ORAL | 6 refills | Status: DC

## 2023-07-06 NOTE — Progress Notes (Signed)
 Laura Eth, DNP, AGNP-c [facility] [address] [place], [place] [address] [phone]   ACUTE VISIT- ESTABLISHED PATIENT  Blood pressure 118/74, pulse 72, weight 147 lb 3.2 oz (66.8 kg).  Subjective:  HPI Laura Dudley is a 23 y.o. female presents to day for evaluation of acute concern(s).   History of Present Illness Laura Dudley is a 23 year old female who presents with left ear pain and recurrent throat ulcerations.  She has been experiencing severe left ear pain since getting water in her ear on Monday. The pain radiates down her throat and worsens with swallowing. She has no history of ear infections and is uncertain if this is the cause. Two weeks ago, she accidentally left a Q-tip in her right ear, causing temporary pain, but it has since resolved.  She reports recurrent ulcerations in her throat and mouth since high school. These ulcerations are painful and sometimes lead her to suspect strep throat. They occur sporadically without identifiable triggers. She finds temporary relief with cough drops and mouthwash for canker sores. No fevers accompany the ulcerations.  She discusses ongoing issues with chest heaviness and elevated heart rate, particularly during physical activity. She has been more active recently, which she feels has helped, but she remains concerned about her heart rate. A heart monitor evaluation two years ago showed no abnormalities. She continues to take an antacid for chest pain, which has been beneficial. She has a family history of heart issues, noting that her mother recently underwent a heart procedure for atrial fibrillation.  She has a history of asthma and uses an inhaler, which she requests a refill for as she is running low. Her asthma symptoms are exacerbated by pollen, which is currently high.  She experienced dry mouth and increased heart rate with a previous medication, amitriptyline, which she has since stopped.  ROS  negative except for what is listed in HPI. History, Medications, Surgery, SDOH, and Family History reviewed and updated as appropriate.  Objective:  Physical Exam Vitals and nursing note reviewed.  Constitutional:      General: She is not in acute distress.    Appearance: Normal appearance. She is normal weight.  HENT:     Head: Normocephalic.     Left Ear: A middle ear effusion is present.     Nose: Nose normal.     Mouth/Throat:     Mouth: Mucous membranes are moist.     Comments: Ulceration to the left posterior oropharynx. Eyes:     Conjunctiva/sclera: Conjunctivae normal.     Pupils: Pupils are equal, round, and reactive to light.  Cardiovascular:     Rate and Rhythm: Normal rate and regular rhythm.     Pulses: Normal pulses.     Heart sounds: Normal heart sounds.  Pulmonary:     Effort: Pulmonary effort is normal.     Breath sounds: Normal breath sounds.  Lymphadenopathy:     Cervical: No cervical adenopathy.  Neurological:     Mental Status: She is alert.         Assessment & Plan:   Problem List Items Addressed This Visit     Asthma   Asthma managed with an inhaler, with recent exacerbations likely due to high pollen levels. She requires the inhaler before activities and during high pollen days. Current inhaler supply is low. Updated guidelines suggest an inhaler with a small amount of steroid for improved management. - Prescribe a new inhaler containing a small amount of steroid, following  updated asthma management guidelines. - Provide a coupon to potentially reduce the cost of the new inhaler.      Relevant Medications   dexamethasone 0.5 MG/5ML elixir   Albuterol-Budesonide (AIRSUPRA) 90-80 MCG/ACT AERO   GERD without esophagitis   GERD symptoms managed with antacids, which have alleviated chest pain. Ongoing symptoms suggest continued GERD management is necessary. - Continue current antacid regimen.       Aphthous ulcer of mouth   Recurrent painful  oral ulcerations since adolescence, with no identifiable triggers. Significant discomfort persists, and vitamin B therapy has been ineffective. Temporary relief achieved with cough drops and mouthwash. Dexamethasone liquid is recommended for topical application to reduce inflammation and pain. - Recommend dexamethasone liquid for gargling or topical application with a Q-tip to the affected area.      Relevant Medications   dexamethasone 0.5 MG/5ML elixir   Non-recurrent acute suppurative otitis media of left ear without spontaneous rupture of tympanic membrane - Primary   Acute left ear infection with purulent effusion behind the tympanic membrane, causing significant otalgia and discomfort. Eustachian tube dysfunction is present, resulting in pharyngitis and dysphagia. No prior history of otitis media. The right ear, previously injured with a Q-tip, has healed without complications. Oral antibiotics are preferred due to the internal nature of the infection. - Prescribe oral antibiotics to be taken twice daily for five days. - Educate on Eustachian tube anatomy and infection impact. - Advise against ear drops due to internal infection.      Relevant Medications   amoxicillin-clavulanate (AUGMENTIN) 875-125 MG tablet   fluconazole (DIFLUCAN) 150 MG tablet   Dysautonomia-like disorder   Suspected dysautonomia with symptoms of tachycardia and chest discomfort, exacerbated by physical activity. Previous cardiac monitoring showed no abnormalities. She is seeking a specialist for further evaluation but faces financial barriers. Propranolol is considered as a potential treatment to manage heart rate, pending further evaluation. - Search for a more affordable specialist for dysautonomia evaluation. - Consider propranolol as a potential treatment to manage heart rate, pending further evaluation.         Laura Eth, DNP, AGNP-c

## 2023-07-07 ENCOUNTER — Other Ambulatory Visit (HOSPITAL_COMMUNITY): Payer: Self-pay

## 2023-07-07 ENCOUNTER — Telehealth: Payer: Self-pay

## 2023-07-07 NOTE — Telephone Encounter (Signed)
 Pharmacy Patient Advocate Encounter  Received notification from [employer] that Prior Authorization for Airsupra 90-80MCG/ACT aerosol  has been APPROVED from 2.26.25 to 3.28.26. Ran test claim, Copay is $35.00. This test claim was processed through [facility]- copay amounts may vary at other pharmacies due to pharmacy/plan contracts, or as the patient moves through the different stages of their insurance plan.   PA #/Case ID/Reference #: Key: [id number]

## 2023-07-10 ENCOUNTER — Encounter: Payer: Self-pay | Admitting: Nurse Practitioner

## 2023-07-10 DIAGNOSIS — H66002 Acute suppurative otitis media without spontaneous rupture of ear drum, left ear: Secondary | ICD-10-CM

## 2023-07-10 DIAGNOSIS — K120 Recurrent oral aphthae: Secondary | ICD-10-CM | POA: Insufficient documentation

## 2023-07-10 DIAGNOSIS — G9089 Other disorders of autonomic nervous system: Secondary | ICD-10-CM | POA: Insufficient documentation

## 2023-07-10 HISTORY — DX: Acute suppurative otitis media without spontaneous rupture of ear drum, left ear: H66.002

## 2023-07-10 HISTORY — DX: Recurrent oral aphthae: K12.0

## 2023-07-10 NOTE — Assessment & Plan Note (Signed)
 Suspected dysautonomia with symptoms of tachycardia and chest discomfort, exacerbated by physical activity. Previous cardiac monitoring showed no abnormalities. She is seeking a specialist for further evaluation but faces financial barriers. Propranolol is considered as a potential treatment to manage heart rate, pending further evaluation. - Search for a more affordable specialist for dysautonomia evaluation. - Consider propranolol as a potential treatment to manage heart rate, pending further evaluation.

## 2023-07-10 NOTE — Assessment & Plan Note (Signed)
 Acute left ear infection with purulent effusion behind the tympanic membrane, causing significant otalgia and discomfort. Eustachian tube dysfunction is present, resulting in pharyngitis and dysphagia. No prior history of otitis media. The right ear, previously injured with a Q-tip, has healed without complications. Oral antibiotics are preferred due to the internal nature of the infection. - Prescribe oral antibiotics to be taken twice daily for five days. - Educate on Eustachian tube anatomy and infection impact. - Advise against ear drops due to internal infection.

## 2023-07-10 NOTE — Assessment & Plan Note (Signed)
 Asthma managed with an inhaler, with recent exacerbations likely due to high pollen levels. She requires the inhaler before activities and during high pollen days. Current inhaler supply is low. Updated guidelines suggest an inhaler with a small amount of steroid for improved management. - Prescribe a new inhaler containing a small amount of steroid, following updated asthma management guidelines. - Provide a coupon to potentially reduce the cost of the new inhaler.

## 2023-07-10 NOTE — Assessment & Plan Note (Signed)
 GERD symptoms managed with antacids, which have alleviated chest pain. Ongoing symptoms suggest continued GERD management is necessary. - Continue current antacid regimen.

## 2023-07-10 NOTE — Assessment & Plan Note (Signed)
 Recurrent painful oral ulcerations since adolescence, with no identifiable triggers. Significant discomfort persists, and vitamin B therapy has been ineffective. Temporary relief achieved with cough drops and mouthwash. Dexamethasone liquid is recommended for topical application to reduce inflammation and pain. - Recommend dexamethasone liquid for gargling or topical application with a Q-tip to the affected area.

## 2023-07-18 NOTE — Progress Notes (Unsigned)
 [facility] Return Visit   Referring Provider Early, Sung Amabile, NP [address] [place],  [place] [address]  Primary Care Provider Early, Sung Amabile, NP  Patient Profile: Laura Dudley is a 23 y.o. female who returns to the [facility] for follow-up of the problem(s) noted below.  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 nightly MiraLAX Ibgard Pantoprazole 40 mg orally daily Ondansetron 4 mg tablet every 8 hours as needed nausea  Interval History  Laura Dudley returns to the office today reporting ongoing symptoms with right-sided abdominal pain as well as alternating diarrhea and constipation and GERD  IBS-mixed with diarrhea and constipation Pain seems to be predominant symptom and has been present for many years She describes her pain is typically right-sided but can occur abruptly in her back when she has flares of IBS When she has what she describes as a "flareup" she will experience diarrhea and cramping Reports that she is typically having 3-5 bowel movements a day but they can vary in terms of consistency and size Sometimes she does report feeling constipated when she is only expelling a small bowel movement No blood or mucus in her stool  She has trialed dicyclomine which alleviated pain but resulted in constipation -she attempted dose titration and reduction IBgard has been helpful Notes that she has experienced stress related to work which could contribute to some of her symptoms Recently became engaged which she is excited about and is a source of "good stress" She monitors her diet and notes that she has a sensitivity to gluten, lactose and ketchup Celiac panel at the time of her last visit was negative although she notes she was gluten free at the  time  Notes intermittent symptoms of migraine headaches and lightheadedness She has had a history of occasional racing heart but no syncope Reports she had a heart monitor test within the last year that did not show abnormalities No documented history of POTS  GERD Laura Dudley reports a history of GERD/indigestion type symptoms for which she takes pantoprazole 40 mg orally daily GERD symptoms are currently well-controlled No nausea, vomiting, dysphagia or odynophagia  Last colonoscopy: None Last endoscopy:  11/2020 - erythematous mucosa in the gastric body, otherwise normal  Last Abd CT/CTE/MRE: None  GI Review of Symptoms Significant for abdominal pain, constipation, diarrhea. Otherwise negative.  General Review of Systems  Review of systems is significant for the pertinent positives and negatives as listed per the HPI.  Full ROS is otherwise negative.  Past Medical History   Past Medical History:  Diagnosis Date   ADHD 02/17/2020   Asthma    H/O pyloric stenosis    IBS (irritable bowel syndrome)    Vitamin D deficiency 12/31/2019     Past Surgical History   Past Surgical History:  Procedure Laterality Date   pyloric stenosis repair       Allergies and Medications  No Known Allergies  No outpatient medications have been marked as taking for the 07/19/23 encounter (Appointment) with Ottie Glazier, MD.   Current Facility-Administered Medications for the 07/19/23 encounter (Appointment) with Ottie Glazier, MD  Medication   0.9 %  sodium chloride infusion     Family History   Family History  Problem Relation Age of Onset   Asthma Mother  Migraines Mother    Gout Father    Bipolar disorder Maternal Grandmother    Depression Maternal Grandmother    Melanoma Paternal Grandfather    Colon cancer Neg Hx    Stomach cancer Neg Hx     Social History   Social History   Tobacco Use   Smoking status: Never   Smokeless tobacco: Never  Vaping Use   Vaping status:  Never Used  Substance Use Topics   Alcohol use: Not Currently   Drug use: Yes    Types: Marijuana   Laura Dudley reports that she has never smoked. She has never used smokeless tobacco. She reports that she does not currently use alcohol. She reports current drug use. Drug: Marijuana.  Vital Signs and Physical Examination   There were no vitals filed for this visit.  General: Well developed, well nourished, no acute distress Head: Normocephalic and atraumatic Eyes: Sclerae anicteric, Ears: Normal auditory acuity Mouth: No deformities or lesions noted Lungs: Clear throughout to auscultation Heart: Regular rate and rhythm; No murmurs, rubs or bruits Abdomen: Soft, tender to palpation over right and mid abdomen non distended.  No rebound or guarding no masses, hepatosplenomegaly or hernias noted. Normal Bowel sounds Rectal: Deferred Musculoskeletal: Symmetrical with no gross deformities  Pulses:  Normal pulses noted Extremities: No edema or deformities noted Neurological: Alert oriented x 4, grossly nonfocal Psychological:  Alert and cooperative. Normal mood and affect   Review of Data  The following data was reviewed at the time of this encounter:  Laboratory Studies      Latest Ref Rng & Units 08/12/2021    9:25 AM 06/07/2021    3:24 PM 11/12/2020    9:05 AM  CBC  WBC 3.4 - 10.8 x10E3/uL 4.0  5.0  4.4   Hemoglobin 11.1 - 15.9 g/dL 96.2  95.2  84.1   Hematocrit 34.0 - 46.6 % 39.6  41.4  38.1   Platelets 150 - 450 x10E3/uL 216  218  197.0     Lab Results  Component Value Date   LIPASE 33.0 11/12/2020      Latest Ref Rng & Units 08/12/2021    9:25 AM 11/12/2020    9:05 AM 12/30/2019   12:20 PM  CMP  Glucose 70 - 99 mg/dL 82  89  79   BUN 6 - 20 mg/dL 9  10  7    Creatinine 0.57 - 1.00 mg/dL 3.24  4.01  0.27   Sodium 134 - 144 mmol/L 141  138  138   Potassium 3.5 - 5.2 mmol/L 4.2  4.0  4.0   Chloride 96 - 106 mmol/L 104  105  103   CO2 20 - 29 mmol/L 22  23  22    Calcium 8.7 -  10.2 mg/dL 8.0  9.1  9.3   Total Protein 6.0 - 8.5 g/dL 6.5  7.1  6.7   Total Bilirubin 0.0 - 1.2 mg/dL 0.3  0.6  0.3   Alkaline Phos 44 - 121 IU/L 86  60  91   AST 0 - 40 IU/L 18  14  14    ALT 0 - 32 IU/L 17  13  15     02/2023 - normal celiac panel  EKG 06/2021-normal sinus rhythm with sinus arrhythmia  Imaging Studies  RUQ [place] 11/2020 - normal  GI Procedures and Studies  11/2020 - erythematous mucosa in the gastric body, otherwise normal   Clinical Impression  It is my clinical impression that Laura Dudley is a  23 y.o. 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  Previous evaluation has included laboratory testing, abdominal ultrasound and upper endoscopy which have been unremarkable.  She has noted sensitivities to gluten and lactose which she limits in her diet.  Based upon her history and normal objective evaluation I do feel that her constellation of symptoms are most consistent with a form of irritable bowel.  Her subtype it appears to be mixed in nature with intermittent constipation and diarrhea.  Her predominant symptom is pain.  Her abdominal pain improved with the use of dicyclomine but this unfortunately resulted in constipation.  She also has a pertinent history of migraine headaches.  We discussed the possibility of trialing amitriptyline given pain predominant IBS and history of migraines.  Reviewed potential side effects of sedation, dry eyes, dry mouth, tachycardia and arrhythmia.  EKG in 2023 showed a normal QT.  Advised that she can continue taking dicyclomine as needed in conjunction with amitriptyline but to do so cautiously due to possible side effects.  Plan  Start amitriptyline 10 mg p.o. nightly.  Advised she contact our office in 2 to 4 weeks to report on her symptoms.  If pain is not improving can do  is titrate to 25 mg p.o. nightly. Continue IBgard as needed Continue dicyclomine as needed Continue pantoprazole 40 mg orally daily 30 minutes before meal and lifestyle modification Continue limiting gluten and lactose in diet Monitor weight and anthropometrics  Planned Follow Up 2-4 months  The patient or caregiver verbalized understanding of the material covered, with no barriers to understanding. All questions were answered. Patient or caregiver is agreeable with the plan outlined above.    It was a pleasure to see Laura Dudley.  If you have any questions or concerns regarding this evaluation, do not hesitate to contact me.  Maren Beach, MD [facility]   I spent total of  30 minutes in both face-to-face and non-face-to-face activities, excluding procedures performed, for the visit on the date of this encounter.

## 2023-07-19 ENCOUNTER — Other Ambulatory Visit

## 2023-07-19 ENCOUNTER — Encounter: Payer: Self-pay | Admitting: Pediatrics

## 2023-07-19 ENCOUNTER — Ambulatory Visit: Payer: BC Managed Care – PPO | Admitting: Pediatrics

## 2023-07-19 VITALS — BP 110/74 | HR 97 | Ht 64.0 in | Wt 146.2 lb

## 2023-07-19 DIAGNOSIS — R110 Nausea: Secondary | ICD-10-CM | POA: Diagnosis not present

## 2023-07-19 DIAGNOSIS — R1031 Right lower quadrant pain: Secondary | ICD-10-CM

## 2023-07-19 DIAGNOSIS — K9041 Non-celiac gluten sensitivity: Secondary | ICD-10-CM | POA: Diagnosis not present

## 2023-07-19 DIAGNOSIS — K582 Mixed irritable bowel syndrome: Secondary | ICD-10-CM

## 2023-07-19 DIAGNOSIS — E739 Lactose intolerance, unspecified: Secondary | ICD-10-CM | POA: Diagnosis not present

## 2023-07-19 DIAGNOSIS — K219 Gastro-esophageal reflux disease without esophagitis: Secondary | ICD-10-CM | POA: Diagnosis not present

## 2023-07-19 MED ORDER — CYPROHEPTADINE HCL 4 MG PO TABS: 4.0000 mg | ORAL_TABLET | Freq: Every day | ORAL | 3 refills | Status: DC

## 2023-07-19 NOTE — Patient Instructions (Signed)
 Your provider has requested that you go to the basement level for lab work before leaving today. Press "B" on the elevator. The lab is located at the first door on the left as you exit the elevator.  Due to recent changes in healthcare laws, you may see the results of your imaging and laboratory studies on MyChart before your provider has had a chance to review them.  We understand that in some cases there may be results that are confusing or concerning to you. Not all laboratory results come back in the same time frame and the provider may be waiting for multiple results in order to interpret others.  Please give [place] 48 hours in order for your provider to thoroughly review all the results before contacting the office for clarification of your results.    You have been scheduled for an abdominal ultrasound at [facility] (1st floor of hospital) on Friday, 07/21/23 at 10:30am. Please arrive 15 minutes prior to your appointment for registration. Make certain not to have anything to eat or drink 6 hours prior to your appointment. Should you need to reschedule your appointment, please contact radiology at [phone]. This test typically takes about 30 minutes to perform.   We have sent the following medications to your pharmacy for you to pick up at your convenience: Cyproheptadine 4mg  at night.  Follow up in 3 months.  Thank you for entrusting me with your care and for choosing [facility], Dr. Maren Beach  _______________________________________________________  If your blood pressure at your visit was 140/90 or greater, please contact your primary care physician to follow up on this.  _______________________________________________________  If you are age 24 or older, your body mass index should be between 23-30. Your Body mass index is 25.1 kg/m. If this is out of the aforementioned range listed, please consider follow up with your Primary Care Provider.  If you are age 11  or younger, your body mass index should be between 19-25. Your Body mass index is 25.1 kg/m. If this is out of the aformentioned range listed, please consider follow up with your Primary Care Provider.   ________________________________________________________  The [facility] providers would like to encourage you to use [facility] to communicate with providers for non-urgent requests or questions.  Due to long hold times on the telephone, sending your provider a message by [facility] may be a faster and more efficient way to get a response.  Please allow 48 business hours for a response.  Please remember that this is for non-urgent requests.  _______________________________________________________

## 2023-07-20 ENCOUNTER — Encounter: Payer: Self-pay | Admitting: Pediatrics

## 2023-07-20 LAB — EXTRA URINE SPECIMEN

## 2023-07-20 LAB — PREGNANCY, URINE: Preg Test, Ur: NEGATIVE

## 2023-07-21 ENCOUNTER — Encounter: Payer: Self-pay | Admitting: Pediatrics

## 2023-07-21 ENCOUNTER — Ambulatory Visit (HOSPITAL_COMMUNITY)
Admission: RE | Admit: 2023-07-21 | Discharge: 2023-07-21 | Disposition: A | Discharge status: Home / Self Care | Source: Ambulatory Visit | Attending: Pediatrics | Admitting: Pediatrics

## 2023-07-21 DIAGNOSIS — R110 Nausea: Secondary | ICD-10-CM | POA: Diagnosis not present

## 2023-07-21 DIAGNOSIS — R1031 Right lower quadrant pain: Secondary | ICD-10-CM | POA: Insufficient documentation

## 2023-07-25 DIAGNOSIS — R1032 Left lower quadrant pain: Secondary | ICD-10-CM | POA: Diagnosis not present

## 2023-07-26 ENCOUNTER — Encounter (HOSPITAL_COMMUNITY): Payer: Self-pay

## 2023-07-26 ENCOUNTER — Ambulatory Visit (HOSPITAL_COMMUNITY)
Admission: EM | Admit: 2023-07-26 | Discharge: 2023-07-26 | Disposition: A | Discharge status: Home / Self Care | Attending: Family Medicine | Admitting: Family Medicine

## 2023-07-26 DIAGNOSIS — R21 Rash and other nonspecific skin eruption: Secondary | ICD-10-CM | POA: Diagnosis not present

## 2023-07-26 MED ORDER — PREDNISONE 20 MG PO TABS
ORAL_TABLET | ORAL | Status: AC
  Filled 2023-07-26: qty 2

## 2023-07-26 MED ORDER — PREDNISONE 20 MG PO TABS
40.0000 mg | ORAL_TABLET | Freq: Once | ORAL | Status: AC
  Administered 2023-07-26: 40 mg via ORAL

## 2023-07-26 NOTE — Discharge Instructions (Addendum)
 Is unclear what is causing your rash.  We have given you an oral steroid here in clinic to help reduce inflammation.  You can take Benadryl 25 to 50 mg every 4-6 hours to help with any breakthrough itching.  This may cause drowsiness and sedation.  If your rash persists or does not improve, return to clinic for reevaluation.

## 2023-07-26 NOTE — ED Provider Notes (Signed)
 [facility]    CSN: [id number] Arrival date & time: 07/26/23  1940      History   Chief Complaint Chief Complaint  Patient presents with   Rash    HPI Laura Dudley is a 23 y.o. female.   Patient presents to clinic over concerns of an itchy rash.  She noticed that this started yesterday on her neck and feels like it is spread to her right arm. She delivered some flowers the other day and it started after this.  Was concerned that it was spreading to her arm.  She did try a topical cortisone cream and an oral Benadryl and the rash has improved a lot.  Does not have any rash to her legs, back or torso.  Uses unscented soaps, detergents and lotions.  Reports her skin is very sensitive.  Does have a history of eczema, but this only happen once.  Also has a history of IBS.  The history is provided by the patient and medical records.  Rash   Past Medical History:  Diagnosis Date   ADHD 02/17/2020   Asthma    H/O pyloric stenosis    IBS (irritable bowel syndrome)    Vitamin D deficiency 12/31/2019    Patient Active Problem List   Diagnosis Date Noted   Aphthous ulcer of mouth 07/10/2023   Non-recurrent acute suppurative otitis media of left ear without spontaneous rupture of tympanic membrane 07/10/2023   Dysautonomia-like disorder 07/10/2023   Facial flushing 08/01/2022   GERD without esophagitis 09/15/2021   Irritable bowel syndrome with both constipation and diarrhea 09/15/2021   Burning sensation of skin 09/15/2021   Migraine without aura and without status migrainosus, not intractable 08/18/2021   Body mass index (BMI) of 22.0 to 22.9 in adult 08/12/2021   History of palpitations 08/12/2021   Raynaud's phenomenon without gangrene 08/12/2021   Irregular periods 06/07/2021   ADHD 02/17/2020   Vitamin D deficiency 12/31/2019   Bowel habit changes 12/30/2019   Menorrhagia with irregular cycle 12/30/2019   Fatigue 12/30/2019   Blurry vision, bilateral  12/30/2019   Atypical nevus of back 12/30/2019   Asthma     Past Surgical History:  Procedure Laterality Date   pyloric stenosis repair      OB History   No obstetric history on file.      Home Medications    Prior to Admission medications   Medication Sig Start Date End Date Taking? Authorizing Provider  albuterol (VENTOLIN HFA) 108 (90 Base) MCG/ACT inhaler Inhale 2 puffs into the lungs every 6 (six) hours as needed for wheezing or shortness of breath. 04/14/22   Early, Sara E, NP  Albuterol-Budesonide (AIRSUPRA) 90-80 MCG/ACT AERO Inhale 2 Inhalations into the lungs every 6 (six) hours as needed. 07/06/23   Early, Sara E, NP  Cholecalciferol (VITAMIN D3) 1.25 MG (50000 UT) CAPS TAKE 1 CAPSULE BY MOUTH ONE TIME PER WEEK 01/17/23   Early, Sara E, NP  cyproheptadine (PERIACTIN) 4 MG tablet Take 1 tablet (4 mg total) by mouth at bedtime. 07/19/23   Truddie Furrow, MD  loratadine (CLARITIN) 10 MG tablet Take 10 mg by mouth daily.    [provider]  pantoprazole (PROTONIX) 40 MG tablet TAKE 1 TABLET BY MOUTH EVERY DAY 06/28/23   Early, Adriane Albe, NP    Family History Family History  Problem Relation Age of Onset   Asthma Mother    Migraines Mother    Gout Father    Bipolar disorder  Maternal Grandmother    Depression Maternal Grandmother    Melanoma Paternal Grandfather    Colon cancer Neg Hx    Stomach cancer Neg Hx     Social History Social History   Tobacco Use   Smoking status: Never   Smokeless tobacco: Never  Vaping Use   Vaping status: Never Used  Substance Use Topics   Alcohol use: Not Currently   Drug use: Yes    Types: Marijuana     Allergies   Patient has no known allergies.   Review of Systems Review of Systems  Per HPI  Physical Exam Triage Vital Signs ED Triage Vitals [07/26/23 2007]  Encounter Vitals Group     BP 115/76     Systolic BP Percentile      Diastolic BP Percentile      Pulse Rate 89     Resp 16     Temp 99.2 F (37.3  C)     Temp Source Oral     SpO2 98 %     Weight      Height      Head Circumference      Peak Flow      Pain Score 0     Pain Loc      Pain Education      Exclude from Growth Chart    No data found.  Updated Vital Signs BP 115/76 (BP Location: Left Arm)   Pulse 89   Temp 99.2 F (37.3 C) (Oral)   Resp 16   LMP 07/14/2023 (Approximate)   SpO2 98%   Visual Acuity Right Eye Distance:   Left Eye Distance:   Bilateral Distance:    Right Eye Near:   Left Eye Near:    Bilateral Near:     Physical Exam Vitals and nursing note reviewed.  Constitutional:      Appearance: Normal appearance.  HENT:     Head: Normocephalic and atraumatic.     Right Ear: External ear normal.     Left Ear: External ear normal.     Nose: Nose normal.     Mouth/Throat:     Mouth: Mucous membranes are moist.  Eyes:     Conjunctiva/sclera: Conjunctivae normal.  Cardiovascular:     Rate and Rhythm: Normal rate.  Pulmonary:     Effort: Pulmonary effort is normal. No respiratory distress.  Skin:    General: Skin is warm and dry.     Findings: Rash present.          Comments: Mild flat rash noted to the right side of the neck and right forearm.  Without vesicles.  It is flat and erythematous.  Neurological:     General: No focal deficit present.     Mental Status: She is alert.  Psychiatric:        Mood and Affect: Mood normal.        Behavior: Behavior is cooperative.      UC Treatments / Results  Labs (all labs ordered are listed, but only abnormal results are displayed) Labs Reviewed - No data to display  EKG   Radiology No results found.  Procedures Procedures (including critical care time)  Medications Ordered in UC Medications  predniSONE (DELTASONE) tablet 40 mg (has no administration in time range)    Initial Impression / Assessment and Plan / UC Course  I have reviewed the triage vital signs and the nursing notes.  Pertinent labs & imaging results that were  available  during my care of the patient were reviewed by me and considered in my medical decision making (see chart for details).  Vitals in triage reviewed, patient is hemodynamically stable.  Unclear etiology to rash, does not appear vesicular in nature.  Overall it is mild, will trial one-time oral steroid.  Encouraged to continue antihistamine and topical hydrocortisone.  Plan of care, follow-up care return precautions given, no questions at this time.     Final Clinical Impressions(s) / UC Diagnoses   Final diagnoses:  Rash and nonspecific skin eruption     Discharge Instructions      Is unclear what is causing your rash.  We have given you an oral steroid here in clinic to help reduce inflammation.  You can take Benadryl 25 to 50 mg every 4-6 hours to help with any breakthrough itching.  This may cause drowsiness and sedation.  If your rash persists or does not improve, return to clinic for reevaluation.    ED Prescriptions   None    PDMP not reviewed this encounter.   Harlow Lighter, Kensie Susman  N, FNP 07/26/23 2029

## 2023-07-26 NOTE — ED Triage Notes (Signed)
 Patient with rash that started yesterday on her neck. It has now spread to her right arm and hand. Patient states it is very itchy. Patient last took benadryl around 1800 this evening.

## 2023-07-27 ENCOUNTER — Encounter: Payer: Self-pay | Admitting: Nurse Practitioner

## 2023-07-27 ENCOUNTER — Ambulatory Visit (INDEPENDENT_AMBULATORY_CARE_PROVIDER_SITE_OTHER): Admitting: Nurse Practitioner

## 2023-07-27 ENCOUNTER — Ambulatory Visit: Payer: Self-pay

## 2023-07-27 ENCOUNTER — Other Ambulatory Visit: Payer: Self-pay

## 2023-07-27 ENCOUNTER — Encounter (HOSPITAL_COMMUNITY): Payer: Self-pay | Admitting: *Deleted

## 2023-07-27 ENCOUNTER — Emergency Department (HOSPITAL_COMMUNITY)
Admission: EM | Admit: 2023-07-27 | Discharge: 2023-07-27 | Discharge status: Against Medical Advice | Attending: Emergency Medicine | Admitting: Emergency Medicine

## 2023-07-27 VITALS — BP 118/76 | HR 98 | Wt 145.6 lb

## 2023-07-27 DIAGNOSIS — L508 Other urticaria: Secondary | ICD-10-CM

## 2023-07-27 DIAGNOSIS — G9089 Other disorders of autonomic nervous system: Secondary | ICD-10-CM | POA: Diagnosis not present

## 2023-07-27 DIAGNOSIS — Z5321 Procedure and treatment not carried out due to patient leaving prior to being seen by health care provider: Secondary | ICD-10-CM | POA: Insufficient documentation

## 2023-07-27 DIAGNOSIS — R232 Flushing: Secondary | ICD-10-CM

## 2023-07-27 DIAGNOSIS — R208 Other disturbances of skin sensation: Secondary | ICD-10-CM

## 2023-07-27 DIAGNOSIS — R21 Rash and other nonspecific skin eruption: Secondary | ICD-10-CM | POA: Insufficient documentation

## 2023-07-27 DIAGNOSIS — K582 Mixed irritable bowel syndrome: Secondary | ICD-10-CM

## 2023-07-27 MED ORDER — MOMETASONE FUROATE 0.1 % EX CREA: TOPICAL_CREAM | CUTANEOUS | 1 refills | Status: DC

## 2023-07-27 MED ORDER — EPINEPHRINE 0.3 MG/0.3ML IJ SOAJ: 0.3000 mg | INTRAMUSCULAR | 2 refills | Status: AC | PRN

## 2023-07-27 MED ORDER — PREDNISONE 20 MG PO TABS: 20.0000 mg | ORAL_TABLET | Freq: Every day | ORAL | 0 refills | Status: DC

## 2023-07-27 NOTE — Assessment & Plan Note (Signed)
 Episode of tingling lips, facial redness, and difficulty breathing following Benadryl intake. She used her inhaler and felt that this did help. It is unclear the cause of this reaction. I do not feel this has any relation to the rash present and have provided reassurance that the very limited surface area to the head and neck would make this very unlikely. Symptoms may be related to anxiety or a panic attack rather than an allergic reaction, however, it is not completely clear. Alternative considerations are increased allergen response to seasonal allergens. The symptoms could also be exacerbated by the stress of the situation. She has not signs of respiratory distress or angioedema present at this time.  - Advise monitoring for recurrence of symptoms and seek medical attention if she occurs. - Discuss the possibility of anxiety contributing to symptoms and consider further evaluation if symptoms persist. - Epipen provided with instructions on how to use.

## 2023-07-27 NOTE — Telephone Encounter (Signed)
 Chief Complaint: Allergic Reaction Symptoms: facial redness, tingling, rash Frequency: Rash since Tuesday Pertinent Negatives: Patient denies fever, SOB, facial swelling Disposition: [] ED /[] Urgent Care (no appt availability in office) / [x] Appointment(In office/virtual)/ []  [facility]/ [] Home Care/ [] Refused Recommended Disposition /[] [facility]/ []  Follow-up with PCP Additional Notes: Pt reports she began with itchy rash to neck and arms on Tuesday, she was seen in urgent care and advised to take Benadryl for itching. Pt notes taking a Benadryl this AM and reports facial flushing and tingling about 30 minutes later, pt checked into ED at this time. Pt notes symptoms have resolved aside from rash and redness which has improved. Pt denies SOB, facial swelling, tingling. OV scheduled per CAL. This RN educated pt on home care, new-worsening symptoms, when to call back/seek emergent care. Pt verbalized understanding and agrees to plan.    Copied from CRM [id number]. Topic: Clinical - Red Word Triage >> Jul 27, 2023 12:23 PM Hobson Luna F wrote: Red Word that prompted transfer to Nurse Triage: Allergic reaction and needs advice on what to do Reason for Disposition  [1] Severe localized itching AND [2] after 2 days of steroid cream  Answer Assessment - Initial Assessment Questions 1. APPEARANCE of RASH: "Describe the rash."      Small raised bumps 2. LOCATION: "Where is the rash located?"      Neck-2 on each side, arm few patches  4. SIZE: "How big are the spots?" (Inches, centimeters or compare to size of a coin)      "Pretty small" 5. ONSET: "When did the rash start?"      Tuesday 6. ITCHING: "Does the rash itch?" If Yes, ask: "How bad is the itch?"  (Scale 0-10; or none, mild, moderate, severe)     Mild 7. PAIN: "Does the rash hurt?" If Yes, ask: "How bad is the pain?"  (Scale 0-10; or none, mild, moderate, severe)    - NONE (0): no pain    - MILD (1-3): doesn't  interfere with normal activities     - MODERATE (4-7): interferes with normal activities or awakens from sleep     - SEVERE (8-10): excruciating pain, unable to do any normal activities     None 8. OTHER SYMPTOMS: "Do you have any other symptoms?" (e.g., fever)     None  Protocols used: Rash or Redness - Localized-A-AH

## 2023-07-27 NOTE — Assessment & Plan Note (Signed)
 Suspected dysautonomia with symptoms of tachycardia and chest discomfort, exacerbated by physical activity. Previous cardiac monitoring showed no abnormalities. She is seeking a specialist for further evaluation but faces financial barriers. Propranolol is considered as a potential treatment to manage heart rate, pending further evaluation. - Search for a more affordable specialist for dysautonomia evaluation. - Consider propranolol as a potential treatment to manage heart rate, pending further evaluation.

## 2023-07-27 NOTE — Progress Notes (Signed)
 Laura Kief, DNP, AGNP-c [facility] [address] [place], [place] [address] [phone]   ACUTE VISIT- ESTABLISHED PATIENT  Blood pressure 118/76, pulse 98, weight 145 lb 9.6 oz (66 kg), last menstrual period 07/14/2023.  Subjective:  HPI Laura Dudley is a 23 y.o. female presents to day for evaluation of acute concern(s).   History of Present Illness Laura Dudley is a 23 year old female who presents with a sudden onset rash followed by breathing difficulties 3 days later.  The rash began on Tuesday, initially appearing on her neck and subsequently spreading to her arm, fingers, and a little on her face. It is very itchy, particularly on her neck, and is not present on her other arm or any other areas on the body. She describes it as 'patchy, really red' and painful. She visited urgent care where she was told it was not poison ivy and was given a one-time dose of a steroid. She has been using Benadryl since the rash began, taking it at 10 AM and 6 PM yesterday, and again at 8 AM on this morning. She usually takes Claritin daily but did not take it today due to the Benadryl use. She has a history of eczema, typically located behind her knees, and has never experienced a rash like this before. She mentions a possible exposure to plants while taking flowers from her boss's yard, which led her to suspect poison ivy, although she has never had it before. Her fianc, who is allergic to poison ivy, was gathering flowers with her and did not have any symptoms.   This morning, she experienced tingling in her lips and redness on her face approximately 10-20 minutes after taking Benadryl. This was followed by difficulty breathing, for which she used her inhalers, which provided some relief. She has a history of using Benadryl without issues in the past. She had no swelling of the lips, tongue, eyes, or face at this time, but did note erythema to the cheeks bilaterally which  she showed a picture of.  She reports a history of anxiety and has experienced panic attacks before but notes that the symptoms she experienced with the rash were different, particularly the lip tingling. No new exposures to soaps, perfumes, or other potential allergens, and no changes in laundry detergent.  She has a family history of heart issues, as her mother recently underwent a heart procedure. She asks about referral for dysautonomia-like symptoms of tachycardia, low blood pressure, chest pressure, and dizziness.    She also mentions ongoing stomach issues and a diagnosis of IBS, for which she is considering dietary management. She is currently on a medication for her stomach issues, which was recently changed due to a high heart rate experienced with a previous medication. She would like a referral for a dietician.  ROS negative except for what is listed in HPI. History, Medications, Surgery, SDOH, and Family History reviewed and updated as appropriate.  Objective:  Physical Exam Constitutional:      General: She is not in acute distress.    Appearance: Normal appearance. She is not ill-appearing, toxic-appearing or diaphoretic.  HENT:     Head: Normocephalic and atraumatic.  Eyes:     General: No scleral icterus.       Right eye: No discharge.        Left eye: No discharge.     Conjunctiva/sclera: Conjunctivae normal.     Pupils: Pupils are equal, round, and reactive to light.  Cardiovascular:  Rate and Rhythm: Normal rate and regular rhythm.     Pulses: Normal pulses.     Heart sounds: Normal heart sounds.  Pulmonary:     Effort: Pulmonary effort is normal. No respiratory distress.     Breath sounds: Normal breath sounds. No stridor. No wheezing, rhonchi or rales.  Musculoskeletal:     Cervical back: Normal range of motion.  Skin:    General: Skin is warm and dry.     Capillary Refill: Capillary refill takes less than 2 seconds.     Findings: Rash present. Rash is  papular and urticarial.          Comments: Erythematous papules in areas marked. No signs of infection present. No pustules.   Neurological:     General: No focal deficit present.     Mental Status: She is alert and oriented to person, place, and time.     Sensory: No sensory deficit.     Motor: No weakness.  Psychiatric:        Behavior: Behavior normal.         Assessment & Plan:   Problem List Items Addressed This Visit     Irritable bowel syndrome with both constipation and diarrhea   Chronic gastrointestinal issues with lower left abdominal pain, likely related to IBS. She has been evaluated by a gastroenterologist and gynecologist, with no other significant findings. She is interested in dietary management to alleviate symptoms. - Refer to a dietitian for dietary management of IBS, including consideration of the FODMAP diet      Facial flushing   Episode of tingling lips, facial redness, and difficulty breathing following Benadryl intake. She used her inhaler and felt that this did help. It is unclear the cause of this reaction. I do not feel this has any relation to the rash present and have provided reassurance that the very limited surface area to the head and neck would make this very unlikely. Symptoms may be related to anxiety or a panic attack rather than an allergic reaction, however, it is not completely clear. Alternative considerations are increased allergen response to seasonal allergens. The symptoms could also be exacerbated by the stress of the situation. She has not signs of respiratory distress or angioedema present at this time.  - Advise monitoring for recurrence of symptoms and seek medical attention if she occurs. - Discuss the possibility of anxiety contributing to symptoms and consider further evaluation if symptoms persist. - Epipen provided with instructions on how to use.       Dysautonomia-like disorder   Suspected dysautonomia with symptoms of  tachycardia and chest discomfort, exacerbated by physical activity. Previous cardiac monitoring showed no abnormalities. She is seeking a specialist for further evaluation but faces financial barriers. Propranolol is considered as a potential treatment to manage heart rate, pending further evaluation. - Search for a more affordable specialist for dysautonomia evaluation. - Consider propranolol as a potential treatment to manage heart rate, pending further evaluation.      Relevant Orders   Ambulatory referral to Cardiology   Acute urticaria - Primary   Acute rash on neck, arm, fingers, and face, likely due to contact with an allergen. I suspect this was a plant given the exposure shortly before onset, but it may not be poison ivy given that her fiance did not have symptoms. The rash is itchy and has a linear pattern, suggesting contact with an external substance and possible scratching the area. Differential diagnosis topical-based allergen. She has eczema,  but the presentation is atypical for eczema. The rash is not harmful and lacks systemic symptoms. The surface area involved is very limited. She experienced a delayed reaction, common with contact dermatitis. Benadryl use was associated with tingling lips and facial redness, suggesting a possible adverse reaction. I have recommended she not continue to take this.  - Prescribe oral prednisone for 4 days to reduce inflammation and itching given the severity of pruritis reported.  - Advise use of mometasone cream topically on affected areas until the rash resolves. - Instruct to avoid further contact with potential allergens and to wash skin thoroughly after exposure. - Recommend using calamine lotion to help dry out the rash and reduce itching. - Advise against using Benadryl due to potential adverse reaction; suggest using Claritin or Zyrtec instead. - Prescribe an EpiPen for emergency use in case of severe allergic reaction.      Relevant  Medications   EPINEPHrine (EPIPEN 2-PAK) 0.3 mg/0.3 mL IJ SOAJ injection   predniSONE (DELTASONE) 20 MG tablet   mometasone (ELOCON) 0.1 % cream   Burning sensation of skin    Time: 44 minutes, >50% spent counseling, care coordination, chart review, and documentation.    Labrenda Lasky E Akera Snowberger, DNP, AGNP-c

## 2023-07-27 NOTE — Patient Instructions (Addendum)
 I have sent in prednisone for you to take for the next 4 days.  Instead of benadryl try Claritin. You can take this up to two times in a day for 4-5 days You may also try famotidine (Pepcid) 20mg  and you can take this up to twice a day for 4-5 days I sent in mometasone for you to use twice a day to the rash. You can also use the clobetasol you have at home.   I also sent in the epipen for you in case you have this happen again.    Contact Dermatitis Dermatitis is redness, soreness, and swelling (inflammation) of the skin. Contact dermatitis is a reaction to certain substances that touch the skin. There are two types of this condition: Irritant contact dermatitis. This is the most common type. It happens when something irritates your skin, such as when your hands get dry from washing them too often with soap. You can get this type of reaction even if you have not been exposed to the irritant before. Allergic contact dermatitis. This type is caused by a substance that you are allergic to, such as poison ivy. It occurs when you have been exposed to the substance (allergen) and form a sensitivity to it. In some cases, the reaction may start soon after your first exposure to the allergen. In other cases, it may not start until you are exposed to the allergen again. It may then occur every time you are exposed to the allergen in the future. What are the causes? Irritant contact dermatitis is often caused by exposure to: Makeup. Soaps, detergents, and bleaches. Acids. Metal salts, such as nickel. Allergic contact dermatitis is often caused by exposure to: Poisonous plants. Chemicals. Jewelry. Latex. Medicines. Preservatives in products, such as clothes.   What increases the risk?  You are more likely to get this condition if you have: A job that exposes you to irritants or allergens. Certain medical conditions. These include asthma and eczema. What are the signs or symptoms? Symptoms of  this condition may occur in any place on your body that has been touched by the irritant. Symptoms include: Dryness, flaking, or cracking. Redness. Itching. Pain or a burning feeling. Blisters. Drainage of small amounts of blood or clear fluid from skin cracks. With allergic contact dermatitis, there may also be swelling in areas such as the eyelids, mouth, or genitals. How is this diagnosed? This condition is diagnosed with a medical history and physical exam. A patch skin test may be done to help figure out the cause. If the condition is related to your job, you may need to see an expert in health problems in the workplace (occupational medicine specialist). How is this treated? This condition is treated by staying away from the cause of the reaction and protecting your skin from further contact. Treatment may also include: Steroid creams or ointments. Steroid medicines may need be taken by mouth (orally) in more severe cases. Antibiotics or medicines applied to the skin to kill bacteria (antibacterial ointments). These may be needed if a skin infection is present. Antihistamines. These may be taken orally or put on as a lotion to ease itching. A bandage (dressing). Follow these instructions at home: Skin care Moisturize your skin as needed. Put cool, wet cloths (cool compresses) on the affected areas. Try applying baking soda paste to your skin. Stir water into baking soda until it has the consistency of a paste. Do not scratch your skin. Avoid friction to the affected area. Avoid  the use of soaps, perfumes, and dyes. Check the affected areas every day for signs of infection. Check for: More redness, swelling, or pain. More fluid or blood. Warmth. Pus or a bad smell. Medicines Take or apply over-the-counter and prescription medicines only as told by your health care provider. If you were prescribed antibiotics, take or apply them as told by your health care provider. Do not stop  using the antibiotic even if you start to feel better. Bathing Try taking a bath with: Epsom salts. Follow the instructions on the packaging. You can get these at your local pharmacy or grocery store. Baking soda. Pour a small amount into the bath as told by your health care provider. Colloidal oatmeal. Follow the instructions on the packaging. You can get this at your local pharmacy or grocery store. Bathe less often. This may mean bathing every other day. Bathe in lukewarm water. Avoid using hot water. Bandage care If you were given a dressing, change it as told by your health care provider. Wash your hands with soap and water for at least 20 seconds before and after you change your dressing. If soap and water are not available, use hand sanitizer. General instructions Avoid the substance that caused your reaction. If you do not know what caused it, keep a journal to try to track what caused it. Write down: What you eat and drink. What cosmetics you use. What you wear in the affected area. This includes jewelry. Contact a health care provider if: Your condition does not get better with treatment. Your condition gets worse. You have any signs of infection. You have a fever. You have new symptoms. Your bone or joint under the affected area becomes painful after the skin has healed. Get help right away if: You notice red streaks coming from the affected area. The affected area turns darker. You have trouble breathing. This information is not intended to replace advice given to you by your health care provider. Make sure you discuss any questions you have with your health care provider. Document Revised: 10/01/2021 Document Reviewed: 10/01/2021 Elsevier Patient Education  2024 [employer].

## 2023-07-27 NOTE — Assessment & Plan Note (Signed)
 Chronic gastrointestinal issues with lower left abdominal pain, likely related to IBS. She has been evaluated by a gastroenterologist and gynecologist, with no other significant findings. She is interested in dietary management to alleviate symptoms. - Refer to a dietitian for dietary management of IBS, including consideration of the FODMAP diet

## 2023-07-27 NOTE — ED Triage Notes (Signed)
 Pt is here for evaluation of rash which began 2 days ago.  Pt was seen at [facility] for this and placed on steroid.  Pt states that she feels like it has gotten worse and this am she also took benadryl.  No swelling of mouth or tongue.

## 2023-07-27 NOTE — Telephone Encounter (Signed)
 1st attempt call back not answered. Routing for no contact follow up.   Copied from CRM [id number]. Topic: Clinical - Medication Question >> Jul 27, 2023  5:23 PM [name] wrote: Reason for CRM: Patient is calling in because she was prescribed prednisone and wants to know if she should start it tonight or wait until the morning. She was seen in office today for a rash and allergic reaction Reason for Disposition . Message left on identified voice mail  Protocols used: No Contact or Duplicate Contact Call-A-AH

## 2023-07-27 NOTE — Assessment & Plan Note (Signed)
 Acute rash on neck, arm, fingers, and face, likely due to contact with an allergen. I suspect this was a plant given the exposure shortly before onset, but it may not be poison ivy given that her fiance did not have symptoms. The rash is itchy and has a linear pattern, suggesting contact with an external substance and possible scratching the area. Differential diagnosis topical-based allergen. She has eczema, but the presentation is atypical for eczema. The rash is not harmful and lacks systemic symptoms. The surface area involved is very limited. She experienced a delayed reaction, common with contact dermatitis. Benadryl use was associated with tingling lips and facial redness, suggesting a possible adverse reaction. I have recommended she not continue to take this.  - Prescribe oral prednisone for 4 days to reduce inflammation and itching given the severity of pruritis reported.  - Advise use of mometasone cream topically on affected areas until the rash resolves. - Instruct to avoid further contact with potential allergens and to wash skin thoroughly after exposure. - Recommend using calamine lotion to help dry out the rash and reduce itching. - Advise against using Benadryl due to potential adverse reaction; suggest using Claritin or Zyrtec instead. - Prescribe an EpiPen for emergency use in case of severe allergic reaction.

## 2023-07-27 NOTE — Telephone Encounter (Signed)
 Copied from CRM [id number]. Topic: Clinical - Medication Question >> Jul 27, 2023  5:23 PM [name] wrote: Reason for CRM: Patient is calling in because she was prescribed prednisone and wants to know if she should start it tonight or wait until the morning. She was seen in office today for a rash and allergic reaction >> Jul 27, 2023  5:55 PM [place] J wrote: Patient is returning a missed call from nurse in clinic. Nurse is no longer in clinic.   Patient calling to ask if it's okay to start her Prednisone in the morning. I advised her that should be fine if that is what she wanted to do. Patient had no further questions or concerns.    Reason for Disposition  Caller has medicine question only, adult not sick, AND triager answers question    Patient with rash, was seen today  Answer Assessment - Initial Assessment Questions 1. NAME of MEDICINE: "What medicine(s) are you calling about?"     Prednisone  2. QUESTION: "What is your question?" (e.g., double dose of medicine, side effect)     Can she start it tomorrow morning  Protocols used: Medication Question Call-A-AH

## 2023-07-29 ENCOUNTER — Other Ambulatory Visit: Payer: Self-pay | Admitting: Nurse Practitioner

## 2023-07-29 DIAGNOSIS — E559 Vitamin D deficiency, unspecified: Secondary | ICD-10-CM

## 2023-07-31 NOTE — Telephone Encounter (Signed)
 I've sent to [facility]. Office was closed Friday as it was Good Friday

## 2023-07-31 NOTE — Telephone Encounter (Signed)
 Did you want the pt. To remain on this.

## 2023-08-02 ENCOUNTER — Encounter: Payer: Self-pay | Admitting: Nurse Practitioner

## 2023-08-04 DIAGNOSIS — L2989 Other pruritus: Secondary | ICD-10-CM | POA: Diagnosis not present

## 2023-08-04 DIAGNOSIS — L309 Dermatitis, unspecified: Secondary | ICD-10-CM | POA: Diagnosis not present

## 2023-08-15 DIAGNOSIS — G901 Familial dysautonomia [Riley-Day]: Secondary | ICD-10-CM | POA: Diagnosis not present

## 2023-09-22 ENCOUNTER — Telehealth: Payer: Self-pay | Admitting: Nurse Practitioner

## 2023-09-22 ENCOUNTER — Other Ambulatory Visit: Payer: Self-pay

## 2023-09-22 DIAGNOSIS — K582 Mixed irritable bowel syndrome: Secondary | ICD-10-CM

## 2023-09-22 NOTE — Telephone Encounter (Signed)
 Copied from CRM [id number]. Topic: Referral - Question >> Sep 21, 2023  4:56 PM Alpha Arts wrote: Reason for CRM: Patient is confused about which cardiologist she should be seeing. She stated she just saw Mirna Amis, MD  Callback #: [phone]

## 2023-09-22 NOTE — Telephone Encounter (Signed)
 Copied from CRM [id number]. Topic: Referral - Request for Referral >> Sep 21, 2023  4:59 PM Alpha Arts wrote: Did the patient discuss referral with their provider in the last year? Yes (If No - schedule appointment) (If Yes - send message)  Appointment offered? No  Type of order/referral and detailed reason for visit: Dietitian  Preference of office, provider, location: [facility] area  If referral order, have you been seen by this specialty before? No (If Yes, this issue or another issue? When? Where?  Can we respond through MyChart? Yes

## 2023-09-25 DIAGNOSIS — Z118 Encounter for screening for other infectious and parasitic diseases: Secondary | ICD-10-CM | POA: Diagnosis not present

## 2023-09-25 DIAGNOSIS — R309 Painful micturition, unspecified: Secondary | ICD-10-CM | POA: Diagnosis not present

## 2023-09-25 DIAGNOSIS — N898 Other specified noninflammatory disorders of vagina: Secondary | ICD-10-CM | POA: Diagnosis not present

## 2023-10-03 DIAGNOSIS — L249 Irritant contact dermatitis, unspecified cause: Secondary | ICD-10-CM | POA: Diagnosis not present

## 2023-10-06 ENCOUNTER — Other Ambulatory Visit: Payer: Self-pay | Admitting: Nurse Practitioner

## 2023-10-06 DIAGNOSIS — K219 Gastro-esophageal reflux disease without esophagitis: Secondary | ICD-10-CM

## 2023-10-06 DIAGNOSIS — R1312 Dysphagia, oropharyngeal phase: Secondary | ICD-10-CM

## 2023-10-09 ENCOUNTER — Telehealth: Payer: Self-pay | Admitting: Nurse Practitioner

## 2023-10-09 ENCOUNTER — Ambulatory Visit: Payer: Self-pay

## 2023-10-09 NOTE — Telephone Encounter (Signed)
 Left message for pt to call back to schedule appt for ear pain

## 2023-10-09 NOTE — Telephone Encounter (Signed)
 FYI Only or Action Required?: FYI only for provider.  Patient was last seen in primary care on 07/27/2023 by Early, Camie BRAVO, NP. Called Nurse Triage reporting Otalgia. Symptoms began a week ago. Interventions attempted: OTC medications: tylenol. Symptoms are: unchanged.  Triage Disposition: See Physician Within 24 Hours  Patient/caregiver understands and will follow disposition?: Yes  --   Gave information for Mobile bus and UC                 Copied from CRM [id number]. Topic: Clinical - Red Word Triage >> Oct 09, 2023  8:34 AM Antwanette L wrote: Red Word that prompted transfer to Nurse Triage: severe left pain and difficultly sleeping Reason for Disposition  Earache  (Exceptions: brief ear pain of < 60 minutes duration, earache occurring during air travel  Answer Assessment - Initial Assessment Questions 1. LOCATION: Which ear is involved?     Left ear 2. ONSET: When did the ear start hurting      A week ago 3. SEVERITY: How bad is the pain?  (Scale 1-10; mild, moderate or severe)   - MILD (1-3): doesn't interfere with normal activities    - MODERATE (4-7): interferes with normal activities or awakens from sleep    - SEVERE (8-10): excruciating pain, unable to do any normal activities      5/10 4. URI SYMPTOMS: Do you have a runny nose or cough?     no 5. FEVER: Do you have a fever? If Yes, ask: What is your temperature, how was it measured, and when did it start?     no 6. CAUSE: Have you been swimming recently?, How often do you use Q-TIPS?, Have you had any recent air travel or scuba diving?     Use q tips daily after showering 7. OTHER SYMPTOMS: Do you have any other symptoms? (e.g., headache, stiff neck, dizziness, vomiting, runny nose, decreased hearing)     no  Protocols used: Earache-A-AH

## 2023-10-09 NOTE — Telephone Encounter (Signed)
 Pt needs to be seen for Ear pain. Please schedule

## 2023-10-09 NOTE — Telephone Encounter (Signed)
 Copied from CRM [id number]. Topic: Referral - Status >> Oct 09, 2023  8:34 AM Laura Dudley wrote: Reason for CRM: Patient had a missed call from the office. Patient is trying to get an update on her referral to see a cardiologist . Patient is requesting a callback at [phone]

## 2023-10-10 ENCOUNTER — Ambulatory Visit (INDEPENDENT_AMBULATORY_CARE_PROVIDER_SITE_OTHER): Admitting: Family Medicine

## 2023-10-10 ENCOUNTER — Encounter: Payer: Self-pay | Admitting: Family Medicine

## 2023-10-10 VITALS — BP 118/70 | HR 93 | Wt 147.8 lb

## 2023-10-10 DIAGNOSIS — H6502 Acute serous otitis media, left ear: Secondary | ICD-10-CM | POA: Diagnosis not present

## 2023-10-10 MED ORDER — AMOXICILLIN-POT CLAVULANATE 875-125 MG PO TABS: 1.0000 | ORAL_TABLET | Freq: Two times a day (BID) | ORAL | 0 refills | Status: DC

## 2023-10-10 NOTE — Progress Notes (Signed)
   Subjective:    Patient ID: Laura Dudley, female    DOB: [birth year], 23 y.o.   MRN: [id number]  HPI She complains of a 1 week history of left ear pressure and discomfort but no fever, chills, sore throat.  She had a similar episode several months ago and was treated with steroids however this has reoccurred.   Review of Systems     Objective:    Physical Exam Right tympanic membrane is normal.  Left tympanic membrane is slightly dull and thickened in appearance.  Throat is clear.  Neck is supple without adenopathy.       Assessment & Plan:  Acute serous otitis media of left ear, recurrence not specified - Plan: amoxicillin -clavulanate (AUGMENTIN ) 875-125 MG tablet She is to call me if not entirely better when she finishes the antibiotic.

## 2023-10-19 NOTE — Progress Notes (Unsigned)
 [facility] Return Visit   Referring Provider Early, Camie BRAVO, NP [address] [place],  [place] [address]  Primary Care Provider Early, Camie BRAVO, NP  Patient Profile: Laura Dudley is a 23 y.o. female who returns to the [facility] for follow-up of the problem(s) noted below.  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 nightly MiraLAX Ibgard Pantoprazole  40 mg orally daily Ondansetron  4 mg tablet every 8 hours as needed nausea  Interval History  Laura Dudley returns to the office today for follow-up of right-sided abdominal pain as well as alternating diarrhea and constipation, bloating, GERD  IBS-mixed with diarrhea and constipation I prescribed amitriptyline  10 mg p.o. nightly for management of symptoms of abdominal pain Asal noted that amitriptyline  ameliorated her abdominal pain but she developed symptoms of racing heart at night prompting her to discontinue the medication At last visit initiated Periactin  4 mg p.o. nightly which worked well to control abdominal pain Notes that she has gained 5 pounds on Periactin  Recently held Periactin  while taking amoxicillin  for an ear infection-discussed that she can resume it  Continues to experience vacillating bowel movements usually 3-5 bowel movements per day varying in size and consistency Had an isolated episode of blood in her stool associated with straining  She has trialed dicyclomine  which alleviated pain but resulted in constipation -she attempted dose titration and reduction IBgard has been helpful Reports significant bloating and provides a photo in the office today showing abdominal distention that occurs once a month She monitors her diet and notes that she has a sensitivity to gluten, lactose  and ketchup Celiac panel was negative 02/2023  Notes intermittent symptoms of migraine headaches and lightheadedness She has had a history of occasional racing heart but no syncope Reports she had a heart monitor test within the last year that did not show abnormalities No documented history of POTS  GERD Laura Dudley reports a history of GERD/indigestion type symptoms for which she takes pantoprazole  40 mg orally daily GERD symptoms are currently well-controlled Recently has developed symptoms of nausea every time she attempts to eat Endorses a sensation of fullness Discussed performing a pregnancy test today  Last colonoscopy: None Last endoscopy:  11/2020 - erythematous mucosa in the gastric body, otherwise normal  Last Abd CT/CTE/MRE: None  GI Review of Symptoms Significant for abdominal pain, constipation, diarrhea. Otherwise negative.  General Review of Systems  Review of systems is significant for the pertinent positives and negatives as listed per the HPI.  Full ROS is otherwise negative.  Past Medical History   Past Medical History:  Diagnosis Date   ADHD 02/17/2020   Aphthous ulcer of mouth 07/10/2023   Asthma    H/O pyloric stenosis    IBS (irritable bowel syndrome)    Non-recurrent acute suppurative otitis media of left ear without spontaneous rupture of tympanic membrane 07/10/2023   Vitamin D  deficiency 12/31/2019     Past Surgical History   Past Surgical History:  Procedure Laterality Date   pyloric stenosis repair       Allergies and Medications  No Known Allergies  No outpatient medications have been marked as taking for the 10/20/23 encounter (Appointment) with Suzann Laura HERO, MD.   Current Facility-Administered Medications for the 10/20/23 encounter (Appointment) with Suzann Laura HERO, MD  Medication   0.9 %  sodium chloride  infusion     Family History   Family History  Problem Relation Age of Onset   Asthma Mother    Migraines Mother    Gout  Father    Bipolar disorder Maternal Grandmother    Depression Maternal Grandmother    Melanoma Paternal Grandfather    Colon cancer Neg Hx    Stomach cancer Neg Hx     Social History   Social History   Tobacco Use   Smoking status: Never   Smokeless tobacco: Never  Vaping Use   Vaping status: Never Used  Substance Use Topics   Alcohol use: Not Currently   Drug use: Yes    Types: Marijuana   Laura Dudley reports that she has never smoked. She has never used smokeless tobacco. She reports that she does not currently use alcohol. She reports current drug use. Drug: Marijuana.  Vital Signs and Physical Examination   There were no vitals filed for this visit. General: Well developed, well nourished, no acute distress Head: Normocephalic and atraumatic Eyes: Sclerae anicteric, Lungs: Clear throughout to auscultation Heart: Regular rate and rhythm; No murmurs, rubs or bruits Abdomen: Soft, tender to palpation over right and mid abdomen non distended.  No rebound or guarding no masses, hepatosplenomegaly or hernias noted. Normal Bowel sounds Rectal: Deferred Musculoskeletal: Symmetrical with no gross deformities  Neurological: Alert oriented x 4, grossly nonfocal Psychological:  Alert and cooperative. Normal mood and affect   Review of Data  The following data was reviewed at the time of this encounter:  Laboratory Studies      Latest Ref Rng & Units 08/12/2021    9:25 AM 06/07/2021    3:24 PM 11/12/2020    9:05 AM  CBC  WBC 3.4 - 10.8 x10E3/uL 4.0  5.0  4.4   Hemoglobin 11.1 - 15.9 g/dL 86.8  86.2  87.2   Hematocrit 34.0 - 46.6 % 39.6  41.4  38.1   Platelets 150 - 450 x10E3/uL 216  218  197.0     Lab Results  Component Value Date   LIPASE 33.0 11/12/2020      Latest Ref Rng & Units 08/12/2021    9:25 AM 11/12/2020    9:05 AM 12/30/2019   12:20 PM  CMP  Glucose 70 - 99 mg/dL 82  89  79   BUN 6 - 20 mg/dL 9  10  7    Creatinine 0.57 - 1.00 mg/dL 9.28  9.20  9.27   Sodium 134  - 144 mmol/L 141  138  138   Potassium 3.5 - 5.2 mmol/L 4.2  4.0  4.0   Chloride 96 - 106 mmol/L 104  105  103   CO2 20 - 29 mmol/L 22  23  22    Calcium 8.7 - 10.2 mg/dL 8.0  9.1  9.3   Total Protein 6.0 - 8.5 g/dL 6.5  7.1  6.7   Total Bilirubin 0.0 - 1.2 mg/dL 0.3  0.6  0.3   Alkaline Phos 44 - 121 IU/L 86  60  91   AST 0 - 40 IU/L 18  14  14    ALT 0 - 32 IU/L 17  13  15     02/2023 - normal celiac panel  EKG 06/2021-normal sinus rhythm with sinus arrhythmia  Imaging Studies  AUS 07/2023 - normal  RUQ US  11/2020 - normal  GI Procedures and Studies  11/2020 - erythematous mucosa in the gastric body, otherwise normal  Clinical Impression  It is my clinical impression that [name] is a 23 y.o. 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  Previous evaluation has included laboratory testing, abdominal ultrasound and upper endoscopy which have been unremarkable.  She has noted sensitivities to gluten and lactose which she limits in her diet.  Based upon her history and normal objective evaluation I do feel that her constellation of symptoms are most consistent with a form of irritable bowel.  Her subtype it appears to be mixed in nature with intermittent constipation and diarrhea.  Her predominant symptom is pain.  Her abdominal pain improved with the use of dicyclomine  but this unfortunately resulted in constipation.  She also has a pertinent history of migraine headaches.  At the time of her last visit I prescribed amitriptyline  10 mg p.o. nightly.  Although it ameliorated her abdominal pain she developed symptoms of palpitations awakening her at night prompting her to discontinue the medication.    At today's visit we discussed other alternative treatments including cyproheptadine , gabapentin, Topamax and Lyrica.  Of  all of these agents, cyproheptadine  likely carries the least side effects the most common of which is sedation.  It may ameliorate both her abdominal pain and nausea.  Kaho was not inclined to trial cyproheptadine  starting at a low dose with further titration as needed.  Plan  Start cyproheptadine  4 mg p.o. nightly with further dose titration as needed Pregnancy test ordered today given symptoms of nausea Schedule abdominal ultrasound given ongoing right lower quadrant abdominal pain Continue IBgard as needed Continue dicyclomine  as needed Continue pantoprazole  40 mg orally daily 30 minutes before meal and lifestyle modification Continue limiting gluten and lactose in diet Monitor weight and anthropometrics  Planned Follow Up 2-4 months  The patient or caregiver verbalized understanding of the material covered, with no barriers to understanding. All questions were answered. Patient or caregiver is agreeable with the plan outlined above.    It was a pleasure to see Laura Dudley.  If you have any questions or concerns regarding this evaluation, do not hesitate to contact me.  Laura Hausen, MD [facility]

## 2023-10-20 ENCOUNTER — Ambulatory Visit (INDEPENDENT_AMBULATORY_CARE_PROVIDER_SITE_OTHER): Admitting: Pediatrics

## 2023-10-20 ENCOUNTER — Encounter: Payer: Self-pay | Admitting: Pediatrics

## 2023-10-20 VITALS — BP 106/64 | HR 99 | Ht 64.0 in | Wt 150.2 lb

## 2023-10-20 DIAGNOSIS — K9041 Non-celiac gluten sensitivity: Secondary | ICD-10-CM | POA: Diagnosis not present

## 2023-10-20 DIAGNOSIS — K219 Gastro-esophageal reflux disease without esophagitis: Secondary | ICD-10-CM

## 2023-10-20 DIAGNOSIS — E739 Lactose intolerance, unspecified: Secondary | ICD-10-CM | POA: Diagnosis not present

## 2023-10-20 DIAGNOSIS — K582 Mixed irritable bowel syndrome: Secondary | ICD-10-CM

## 2023-10-20 DIAGNOSIS — K589 Irritable bowel syndrome without diarrhea: Secondary | ICD-10-CM

## 2023-10-20 NOTE — Patient Instructions (Addendum)
 Probiotics are microorganisms which may be beneficial to the function of the gastrointestinal tract and are used in several different GI conditions including Irritable Bowel Syndrome and following infections involving the GI tract.  Three commonly used probiotics are:  Culturelle, Florastor, and Align.  We recommend you take one of these probiotics once daily.  Continue IBgard and Periactin .  Thank you for entrusting me with your care and for choosing [facility], Dr. Inocente Hausen  _______________________________________________________  If your blood pressure at your visit was 140/90 or greater, please contact your primary care physician to follow up on this.  _______________________________________________________  If you are age 34 or older, your body mass index should be between 23-30. Your Body mass index is 25.79 kg/m. If this is out of the aforementioned range listed, please consider follow up with your Primary Care Provider.  If you are age 57 or younger, your body mass index should be between 19-25. Your Body mass index is 25.79 kg/m. If this is out of the aformentioned range listed, please consider follow up with your Primary Care Provider.   ________________________________________________________  The [facility] providers would like to encourage you to use MYCHART to communicate with providers for non-urgent requests or questions.  Due to long hold times on the telephone, sending your provider a message by [facility] may be a faster and more efficient way to get a response.  Please allow 48 business hours for a response.  Please remember that this is for non-urgent requests.  _______________________________________________________

## 2023-10-24 ENCOUNTER — Ambulatory Visit: Payer: Self-pay

## 2023-10-24 NOTE — Telephone Encounter (Signed)
 Pt is scheduled for tomorrow with Laura Dudley

## 2023-10-24 NOTE — Telephone Encounter (Signed)
 FYI Only or Action Required?: Action required by provider: clinical question for provider.  Patient was last seen in primary care on 10/10/2023 by Joyce Norleen BROCKS, MD.  Called Nurse Triage reporting Blister.  Symptoms began several weeks ago.  Interventions attempted: Nothing.  Symptoms are: gradually worsening.  Triage Disposition: See PCP When Office is Open (Within 3 Days)  Patient/caregiver understands and will follow disposition?: Yes                             Copied from CRM [id number]. Topic: Clinical - Red Word Triage >> Oct 24, 2023  3:52 PM Everette C wrote: [facility] that prompted transfer to Nurse Triage: The patient currently has blisters that have appeared suddenly and increasingly on their upper and lower lips within the past 1-2 weeks Reason for Disposition  4 or more sores (ulcers)  Answer Assessment - Initial Assessment Questions 1. LOCATION: Where is the mouth sore (ulcer) located?      2 ulcers on back of throat, multiple (roughly 6) blisters on top and bottom lips 3. SIZE: How large is the sore?  (e.g., size of an apple seed, watermelon seed, pencil eraser)     Ulcers in back of throat are slightly smaller than a penny 4. PAIN: Are they painful? If Yes, ask: How bad is it?  (Scale 0-10; or none, mild, moderate, severe)     States it hurts to eat, drink and swallow 5. ONSET: When did you first notice the sore?      1-2 weeks 6. RECURRENT SYMPTOM: Have you had a mouth ulcer before? If Yes, ask: When was the last time? and What happened that time?      Yes, states she is prone to Canker sores and throat ulcers 7. CAUSE: What do you think is causing the mouth sore?     Unsure 8. OTHER SYMPTOMS: Do you have any other symptoms? (e.g., fever, swollen lymph node)     States she has felt achy, but denies fever, denies difficulty breathing    Patient scheduled for Thursday of this week. Patient is seeking care advice for  symptoms from provider to help until then. Please advise.  Protocols used: Mouth Ulcers-A-AH

## 2023-10-25 ENCOUNTER — Ambulatory Visit (INDEPENDENT_AMBULATORY_CARE_PROVIDER_SITE_OTHER): Admitting: Medical

## 2023-10-25 VITALS — BP 120/70 | HR 97 | Temp 99.1°F | Wt 149.4 lb

## 2023-10-25 DIAGNOSIS — K582 Mixed irritable bowel syndrome: Secondary | ICD-10-CM

## 2023-10-25 DIAGNOSIS — K219 Gastro-esophageal reflux disease without esophagitis: Secondary | ICD-10-CM

## 2023-10-25 DIAGNOSIS — K121 Other forms of stomatitis: Secondary | ICD-10-CM

## 2023-10-25 MED ORDER — TRIAMCINOLONE ACETONIDE 0.1 % MT PSTE: 1.0000 | PASTE | Freq: Two times a day (BID) | OROMUCOSAL | 1 refills | Status: AC

## 2023-10-25 NOTE — Progress Notes (Signed)
 Subjective:  Laura Dudley is a 23 y.o. female who presents for Chief Complaint  Patient presents with   Acute Visit    Uclers inside of mouth. Some on bottom lips and some on top lips and 2 in back of throat, has had it since last week. They keep popping up. Still having some ear pain from last weeks visit     Here with multiple mouth ulcers.   Started 2 weeks ago with 2 ulcers, but over the 2 weeks has developed multiple uclers in the mouth.  Also has had some sore throat, left ear pain.  Has some in back of throat as well.   Slight cough.  No congestion.  Some headaches, but getting ready to start period.   No nausea, no vomiting, no joint aches.   No joint swelling, no rash.   Using some tylenol for symptoms.  Using some salt water gargles once daily.  No sick contacts with mouth ulcers recently.  Has fiance and he has had no recent symptoms.     Been on Protonix  for past year.  Used inhaler some last week.  She has been moving and having a little asthma flare ups   She takes cyproheptadine  at night for stomach issues and sleep  Sees GI.  She just saw gastro recently  No other aggravating or relieving factors.    No other c/o.  Past Medical History:  Diagnosis Date   ADHD 02/17/2020   Aphthous ulcer of mouth 07/10/2023   Asthma    Dysautonomia (HCC)    H/O pyloric stenosis    IBS (irritable bowel syndrome)    Non-recurrent acute suppurative otitis media of left ear without spontaneous rupture of tympanic membrane 07/10/2023   Vitamin D  deficiency 12/31/2019   Current Outpatient Medications on File Prior to Visit  Medication Sig Dispense Refill   albuterol  (VENTOLIN  HFA) 108 (90 Base) MCG/ACT inhaler Inhale 2 puffs into the lungs every 6 (six) hours as needed for wheezing or shortness of breath. 8 g 3   Albuterol -Budesonide (AIRSUPRA ) 90-80 MCG/ACT AERO Inhale 2 Inhalations into the lungs every 6 (six) hours as needed. 17.7 g 3   Cholecalciferol (VITAMIN D3) 1.25 MG (50000  UT) CAPS TAKE 1 CAPSULE BY MOUTH ONE TIME PER WEEK 12 capsule 3   cyproheptadine  (PERIACTIN ) 4 MG tablet Take 1 tablet (4 mg total) by mouth at bedtime. 30 tablet 3   EPINEPHrine  (EPIPEN  2-PAK) 0.3 mg/0.3 mL IJ SOAJ injection Inject 0.3 mg into the muscle as needed for anaphylaxis. Call 911 immediately. 2 each 2   fluocinonide cream (LIDEX) 0.05 % Apply 1 Application topically 2 (two) times daily.     loratadine (CLARITIN) 10 MG tablet Take 10 mg by mouth daily.     metroNIDAZOLE (METROCREAM) 0.75 % cream Apply 1 Application topically 2 (two) times daily.     mometasone  (ELOCON ) 0.1 % cream Apply thin layer to rash 2 times a day. 45 g 1   nystatin-triamcinolone  ointment (MYCOLOG) Apply 1 Application topically 2 (two) times daily.     pantoprazole  (PROTONIX ) 40 MG tablet TAKE 1 TABLET BY MOUTH EVERY DAY 30 tablet 2   No current facility-administered medications on file prior to visit.     The following portions of the patient's history were reviewed and updated as appropriate: allergies, current medications, past family history, past medical history, past social history, past surgical history and problem list.  ROS Otherwise as in subjective above  Objective: BP 120/70  Pulse 97   Temp 99.1 F (37.3 C)   Wt 149 lb 6.4 oz (67.8 kg)   SpO2 99%   BMI 25.64 kg/m   General appearance: alert, no distress, well developed, well nourished HEENT: normocephalic, sclerae anicteric, conjunctiva pink and moist, TMs pearly, nares patent, no discharge or erythema, pharynx normal Oral cavity: She has several ulcerations approximately 2 to 3 mm diameter.  She has a couple on the inside of upper and lower lips, and several in the back of the pharynx.   Neck: supple, no lymphadenopathy, no thyromegaly, no masses    Assessment: Encounter Diagnoses  Name Primary?   Mouth ulcer Yes   Irritable bowel syndrome with both constipation and diarrhea    GERD without esophagitis      Plan: We  discussed symptoms and findings.  She can use salt water gargles throughout the day, good water intake in general.  Begin Kenalog  paste as below topically as needed for the ulcer just inside the lips.  I will reach out to gastroenterology about her other medicines and possible risk for ulcers given some of her medications.   Laura Dudley was seen today for acute visit.  Diagnoses and all orders for this visit:  Mouth ulcer  Irritable bowel syndrome with both constipation and diarrhea  GERD without esophagitis  Other orders -     triamcinolone  (KENALOG ) 0.1 % paste; Use as directed 1 Application in the mouth or throat 2 (two) times daily.    Follow up: pending call back

## 2023-10-26 ENCOUNTER — Ambulatory Visit: Admitting: Family Medicine

## 2023-10-26 ENCOUNTER — Other Ambulatory Visit: Payer: Self-pay | Admitting: Pediatrics

## 2023-10-26 ENCOUNTER — Encounter: Payer: Self-pay | Admitting: Pediatrics

## 2023-10-26 MED ORDER — FAMOTIDINE 40 MG PO TABS: 40.0000 mg | ORAL_TABLET | Freq: Every day | ORAL | 3 refills | Status: AC

## 2023-10-27 ENCOUNTER — Encounter: Payer: Self-pay | Admitting: Internal Medicine

## 2023-10-29 ENCOUNTER — Emergency Department (HOSPITAL_BASED_OUTPATIENT_CLINIC_OR_DEPARTMENT_OTHER)
Admission: EM | Admit: 2023-10-29 | Discharge: 2023-10-29 | Disposition: A | Discharge status: Home / Self Care | Attending: Emergency Medicine | Admitting: Emergency Medicine

## 2023-10-29 ENCOUNTER — Emergency Department (HOSPITAL_BASED_OUTPATIENT_CLINIC_OR_DEPARTMENT_OTHER)

## 2023-10-29 ENCOUNTER — Other Ambulatory Visit: Payer: Self-pay

## 2023-10-29 ENCOUNTER — Encounter (HOSPITAL_BASED_OUTPATIENT_CLINIC_OR_DEPARTMENT_OTHER): Payer: Self-pay | Admitting: Radiology

## 2023-10-29 DIAGNOSIS — K589 Irritable bowel syndrome without diarrhea: Secondary | ICD-10-CM | POA: Diagnosis not present

## 2023-10-29 DIAGNOSIS — R1031 Right lower quadrant pain: Secondary | ICD-10-CM

## 2023-10-29 LAB — PREGNANCY, URINE: Preg Test, Ur: NEGATIVE

## 2023-10-29 LAB — URINALYSIS, MICROSCOPIC (REFLEX)

## 2023-10-29 LAB — CBC
HCT: 38.8 % (ref 36.0–46.0)
Hemoglobin: 12.9 g/dL (ref 12.0–15.0)
MCH: 28.5 pg (ref 26.0–34.0)
MCHC: 33.2 g/dL (ref 30.0–36.0)
MCV: 85.7 fL (ref 80.0–100.0)
Platelets: 241 K/uL (ref 150–400)
RBC: 4.53 MIL/uL (ref 3.87–5.11)
RDW: 12.1 % (ref 11.5–15.5)
WBC: 5.6 K/uL (ref 4.0–10.5)
nRBC: 0 % (ref 0.0–0.2)

## 2023-10-29 LAB — COMPREHENSIVE METABOLIC PANEL WITH GFR
ALT: 14 U/L (ref 0–44)
AST: 20 U/L (ref 15–41)
Albumin: 4.3 g/dL (ref 3.5–5.0)
Alkaline Phosphatase: 97 U/L (ref 38–126)
Anion gap: 11 (ref 5–15)
BUN: 7 mg/dL (ref 6–20)
CO2: 25 mmol/L (ref 22–32)
Calcium: 9.3 mg/dL (ref 8.9–10.3)
Chloride: 106 mmol/L (ref 98–111)
Creatinine, Ser: 0.9 mg/dL (ref 0.44–1.00)
GFR, Estimated: >60 mL/min (ref >60)
Glucose, Bld: 109 mg/dL — ABNORMAL HIGH (ref 70–99)
Potassium: 3.9 mmol/L (ref 3.5–5.1)
Sodium: 142 mmol/L (ref 135–145)
Total Bilirubin: 0.2 mg/dL (ref 0.0–1.2)
Total Protein: 7.3 g/dL (ref 6.5–8.1)

## 2023-10-29 LAB — URINALYSIS, ROUTINE W REFLEX MICROSCOPIC
Bilirubin Urine: NEGATIVE
Glucose, UA: NEGATIVE mg/dL
Ketones, ur: NEGATIVE mg/dL
Leukocytes,Ua: NEGATIVE
Nitrite: NEGATIVE
Protein, ur: NEGATIVE mg/dL
Specific Gravity, Urine: 1.015 (ref 1.005–1.030)
pH: 7 (ref 5.0–8.0)

## 2023-10-29 LAB — LIPASE, BLOOD: Lipase: 29 U/L (ref 11–51)

## 2023-10-29 MED ORDER — IOHEXOL 300 MG/ML  SOLN
80.0000 mL | Freq: Once | INTRAMUSCULAR | Status: AC | PRN
  Administered 2023-10-29: 80 mL via INTRAVENOUS

## 2023-10-29 NOTE — ED Provider Notes (Signed)
 [place] [facility] Provider Note   CSN: [id number] Arrival date & time: 10/29/23  8073     Patient presents with: Abdominal Pain   Laura Dudley is a 23 y.o. female.  With history of IBS who presents to the ED for abdominal pain.  Patient first experienced abdominal pain earlier tonight shortly after seeing a movie with her fianc.  Pain localized over the right lower quadrant with some associated nausea.  She frequently has abdominal pain related to her IBS but this feels more severe than usual.  Last bowel movement today was normal.  No urinary symptoms.  She is currently on her menstrual period.  Denies fevers chills chest pain shortness of breath recent illness    Abdominal Pain      Prior to Admission medications   Medication Sig Start Date End Date Taking? Authorizing Provider  albuterol  (VENTOLIN  HFA) 108 (90 Base) MCG/ACT inhaler Inhale 2 puffs into the lungs every 6 (six) hours as needed for wheezing or shortness of breath. 04/14/22   Early, Sara E, NP  Albuterol -Budesonide (AIRSUPRA ) 90-80 MCG/ACT AERO Inhale 2 Inhalations into the lungs every 6 (six) hours as needed. 07/06/23   Early, Sara E, NP  Cholecalciferol (VITAMIN D3) 1.25 MG (50000 UT) CAPS TAKE 1 CAPSULE BY MOUTH ONE TIME PER WEEK 08/01/23   Early, Sara E, NP  cyproheptadine  (PERIACTIN ) 4 MG tablet Take 1 tablet (4 mg total) by mouth at bedtime. 07/19/23   Suzann Inocente HERO, MD  EPINEPHrine  (EPIPEN  2-PAK) 0.3 mg/0.3 mL IJ SOAJ injection Inject 0.3 mg into the muscle as needed for anaphylaxis. Call 911 immediately. 07/27/23   Early, Sara E, NP  famotidine  (PEPCID ) 40 MG tablet Take 1 tablet (40 mg total) by mouth daily. 10/26/23   Suzann Inocente HERO, MD  fluocinonide cream (LIDEX) 0.05 % Apply 1 Application topically 2 (two) times daily. 08/08/23   [provider]  loratadine (CLARITIN) 10 MG tablet Take 10 mg by mouth daily.    [provider]  metroNIDAZOLE  (METROCREAM) 0.75 % cream Apply 1 Application topically 2 (two) times daily. 07/02/23   [provider]  mometasone  (ELOCON ) 0.1 % cream Apply thin layer to rash 2 times a day. 07/27/23   Early, Sara E, NP  nystatin-triamcinolone  ointment (MYCOLOG) Apply 1 Application topically 2 (two) times daily. 09/25/23   [provider]  pantoprazole  (PROTONIX ) 40 MG tablet TAKE 1 TABLET BY MOUTH EVERY DAY 10/06/23   Early, Sara E, NP  triamcinolone  (KENALOG ) 0.1 % paste Use as directed 1 Application in the mouth or throat 2 (two) times daily. 10/25/23   Tysinger, Alm RAMAN, PA-C    Allergies: Patient has no known allergies.    Review of Systems  Gastrointestinal:  Positive for abdominal pain.    Updated Vital Signs BP 122/88 (BP Location: Right Arm)   Pulse 79   Temp 98.6 F (37 C) (Oral)   Resp 16   Ht 5' 4 (1.626 m)   Wt 67.1 kg   SpO2 98%   BMI 25.40 kg/m   Physical Exam Vitals and nursing note reviewed.  HENT:     Head: Normocephalic and atraumatic.  Eyes:     Pupils: Pupils are equal, round, and reactive to light.  Cardiovascular:     Rate and Rhythm: Normal rate and regular rhythm.  Pulmonary:     Effort: Pulmonary effort is normal.     Breath sounds: Normal breath sounds.  Abdominal:  Palpations: Abdomen is soft.     Tenderness: There is abdominal tenderness in the right lower quadrant. There is no guarding or rebound.  Skin:    General: Skin is warm and dry.  Neurological:     Mental Status: She is alert.  Psychiatric:        Mood and Affect: Mood normal.     (all labs ordered are listed, but only abnormal results are displayed) Labs Reviewed  COMPREHENSIVE METABOLIC PANEL WITH GFR - Abnormal; Notable for the following components:      Result Value   Glucose, Bld 109 (*)    All other components within normal limits  URINALYSIS, ROUTINE W REFLEX MICROSCOPIC - Abnormal; Notable for the following components:   Hgb urine dipstick LARGE (*)    All other  components within normal limits  URINALYSIS, MICROSCOPIC (REFLEX) - Abnormal; Notable for the following components:   Bacteria, UA FEW (*)    All other components within normal limits  LIPASE, BLOOD  CBC  PREGNANCY, URINE    EKG: None  Radiology: CT ABDOMEN PELVIS W CONTRAST Result Date: 10/29/2023 EXAM: CT ABDOMEN AND PELVIS WITH CONTRAST 10/29/2023 10:50:00 PM TECHNIQUE: CT of the abdomen and pelvis was performed with the administration of 80mL of intravenous iohexol  (OMNIPAQUE ) 300 MG/ML solution. Multiplanar reformatted images are provided for review. Automated exposure control, iterative reconstruction, and/or weight based adjustment of the mA/kV was utilized to reduce the radiation dose to as low as reasonably achievable. COMPARISON: None available. CLINICAL HISTORY: RLQ pain. FINDINGS: LOWER CHEST: No acute abnormality. LIVER: The liver is unremarkable. GALLBLADDER AND BILE DUCTS: Gallbladder is unremarkable. No biliary ductal dilatation. SPLEEN: No acute abnormality. PANCREAS: No acute abnormality. ADRENAL GLANDS: No acute abnormality. KIDNEYS, URETERS AND BLADDER: No stones in the kidneys or ureters. No hydronephrosis. No perinephric or periureteral stranding. Urinary bladder is unremarkable. GI AND BOWEL: Normal appendix (image 58). Stomach demonstrates no acute abnormality. There is no bowel obstruction. No bowel wall thickening. PERITONEUM AND RETROPERITONEUM: No ascites. No free air. VASCULATURE: Aorta is normal in caliber. LYMPH NODES: No lymphadenopathy. REPRODUCTIVE ORGANS: Uterus and bilateral ovaries are within normal limits. BONES AND SOFT TISSUES: No acute osseous abnormality. No focal soft tissue abnormality. IMPRESSION: 1. No acute findings in the abdomen or pelvis. 2. Normal appendix. Electronically signed by: Pinkie Pebbles MD 10/29/2023 11:02 PM EDT RP Workstation: [id number]     Procedures   Medications Ordered in the ED  iohexol  (OMNIPAQUE ) 300 MG/ML solution 80 mL  (80 mLs Intravenous Contrast Given 10/29/23 2250)    Clinical Course as of 10/29/23 2321  Sun Oct 29, 2023  2316 Laboratory workup unremarkable overall.  No leukocytosis.  No significant metabolic maladies on CMP.  UA shows blood most consistent with menstrual period no evidence of infection.  CT abdomen pelvis shows no evidence of appendicitis or other acute pathology.  Patient states pain is tolerable at this time.  Stable for discharge with instruction for PCP follow-up [MP]    Clinical Course User Index [MP] Pamella Ozell LABOR, DO                                 Medical Decision Making 23 year old female with history as above presents to the ED given concern for right lower quadrant pain beginning tonight.  Associated nausea.  Afebrile normotensive.  Right lower quadrant has tenderness on my exam without rebound rigidity or guarding.    Differential diagnosis  includes: IBS flare Pregnancy Acute intra-abdominal infectious/inflammatory process such as appendicitis, diverticulitis, pancreatitis and cholecystitis Urinary tract infection Viral gastroenteritis  Will obtain laboratory workup including CBC with differential, metabolic panel, lipase,  hCG, urinalysis and CT abdomen pelvis once pregnancy test comes back negative.  Amount and/or Complexity of Data Reviewed Labs: ordered. Radiology: ordered.  Risk Prescription drug management.        Final diagnoses:  Irritable bowel syndrome, unspecified type  Right lower quadrant abdominal pain    ED Discharge Orders     None          Pamella Ozell LABOR, DO 10/29/23 2321

## 2023-10-29 NOTE — ED Triage Notes (Signed)
 Endorses pain on the right abd upper and lower. Pain started 30 minutes ago. She endorses she is on her period but no history of endometriosis or ovarian cysts. Urinating isnt painful but when she does urinate her stomach hurts from the pushing. Endorses BM this morning. She does have IBS.

## 2023-10-29 NOTE — Discharge Instructions (Signed)
 You were seen in the emergency room for abdominal pain Your blood work and CAT scan did not show any acute findings Your pregnancy test was negative There is no evidence of appendicitis The most likely cause of your abdominal pain would be another IBS flare Please follow-up with your primary care doctor and continue taking all previous prescribed medications Return to the emerged department for severe abdominal pain or any other concerns

## 2023-10-31 ENCOUNTER — Ambulatory Visit: Admitting: Skilled Nursing Facility1

## 2023-11-10 DIAGNOSIS — J029 Acute pharyngitis, unspecified: Secondary | ICD-10-CM | POA: Diagnosis not present

## 2023-11-12 DIAGNOSIS — R1011 Right upper quadrant pain: Secondary | ICD-10-CM | POA: Diagnosis not present

## 2023-11-12 DIAGNOSIS — J039 Acute tonsillitis, unspecified: Secondary | ICD-10-CM | POA: Diagnosis not present

## 2023-11-16 ENCOUNTER — Other Ambulatory Visit: Payer: Self-pay | Admitting: Pediatrics

## 2023-11-21 ENCOUNTER — Ambulatory Visit: Payer: Self-pay | Admitting: Nurse Practitioner

## 2023-11-21 ENCOUNTER — Encounter: Payer: Self-pay | Admitting: Nurse Practitioner

## 2023-11-21 ENCOUNTER — Ambulatory Visit (INDEPENDENT_AMBULATORY_CARE_PROVIDER_SITE_OTHER): Admitting: Nurse Practitioner

## 2023-11-21 VITALS — BP 120/80 | HR 92 | Wt 147.2 lb

## 2023-11-21 DIAGNOSIS — K219 Gastro-esophageal reflux disease without esophagitis: Secondary | ICD-10-CM

## 2023-11-21 DIAGNOSIS — K1379 Other lesions of oral mucosa: Secondary | ICD-10-CM

## 2023-11-21 DIAGNOSIS — J4520 Mild intermittent asthma, uncomplicated: Secondary | ICD-10-CM | POA: Diagnosis not present

## 2023-11-21 DIAGNOSIS — R1084 Generalized abdominal pain: Secondary | ICD-10-CM

## 2023-11-21 DIAGNOSIS — R52 Pain, unspecified: Secondary | ICD-10-CM | POA: Insufficient documentation

## 2023-11-21 DIAGNOSIS — M7918 Myalgia, other site: Secondary | ICD-10-CM | POA: Diagnosis not present

## 2023-11-21 DIAGNOSIS — R8290 Unspecified abnormal findings in urine: Secondary | ICD-10-CM | POA: Insufficient documentation

## 2023-11-21 DIAGNOSIS — G43009 Migraine without aura, not intractable, without status migrainosus: Secondary | ICD-10-CM

## 2023-11-21 DIAGNOSIS — G9089 Other disorders of autonomic nervous system: Secondary | ICD-10-CM

## 2023-11-21 LAB — POCT URINALYSIS DIP (CLINITEK)
Bilirubin, UA: NEGATIVE
Glucose, UA: NEGATIVE mg/dL
Ketones, POC UA: NEGATIVE mg/dL
Leukocytes, UA: NEGATIVE
Nitrite, UA: NEGATIVE
POC PROTEIN,UA: NEGATIVE
Spec Grav, UA: 1.01 (ref 1.010–1.025)
Urobilinogen, UA: 0.2 U/dL
pH, UA: 6 (ref 5.0–8.0)

## 2023-11-21 MED ORDER — SUCRALFATE 1 GM/10ML PO SUSP: ORAL | 0 refills | Status: DC

## 2023-11-21 NOTE — Assessment & Plan Note (Signed)
 Increased use of inhaler noted, but lung examination is clear. No wheezing or acute respiratory distress observed. Unclear if symptoms are exacerbated by uncontrolled GERD due to report of inhaler use needed while in bed. Consider restart of PPI if continues. Recommend discuss with GI provider for guidance on recommendations.

## 2023-11-21 NOTE — Assessment & Plan Note (Signed)
 Recurrent chest pain, previously managed with pantoprazole , now switched to famotidine . Current medication not providing relief, and chest pain has returned. Consider changing back to PPI. Recommend discussion with GI for recommendations.

## 2023-11-21 NOTE — Assessment & Plan Note (Signed)
 Menstrual-related migraines, consistent with previous patterns.

## 2023-11-21 NOTE — Assessment & Plan Note (Signed)
 Repeat UA to determine if this could be a contributor to current symptoms.

## 2023-11-21 NOTE — Patient Instructions (Addendum)
 Look through the following to see if you note any correlation with the oral ulcers.   Aphthous ulcers (also called canker sores) are small, painful sores inside the mouth. Their exact cause isn't always known, but several triggers and underlying factors can contribute.  Common Causes & Triggers 1. Local Trauma Biting the inside of the cheek or lip Braces or dental appliances rubbing the mouth Aggressive tooth brushing Sharp or broken teeth  2. Nutritional Deficiencies Iron deficiency Vitamin B12 deficiency Folate deficiency Zinc deficiency  3. Stress & Hormonal Changes Emotional stress Menstrual cycle-related hormonal fluctuations  4. Food Sensitivities Acidic foods (tomatoes, citrus) Spicy foods Certain nuts (especially walnuts) Gluten (in those with celiac disease)  5. Immune-Related Causes Autoimmune conditions (Behet's disease- oral and genital ulcers, eye inflammation; lupus-rashes, fevers, joint pain, kidney involvement, sun sensitivity) Inflammatory bowel disease (Crohn's- chronic loose/watery stool with blood, ulcerative colitis-chronic diarrhea with/without blood) Celiac disease  6. Infections Often non-contagious, but viral infections (like hand-foot-and-mouth disease) can have similar mouth lesions  7. Medication-Related NSAIDs (like ibuprofen) Beta-blockers Certain chemotherapy drugs  8. Other Factors Genetic predisposition (tends to run in families) Smoking cessation (ulcers often appear shortly after quitting) HIV or immune suppression

## 2023-11-21 NOTE — Assessment & Plan Note (Signed)
 Recurrent oral ulcers with significant pain of unknown etiology. Previous treatment with topical steroid cream provided some relief. Has seen dentist with recommendations for chlorhexidine oral rinse. No clear pattern identified, and dietary changes have not prevented recurrence. Potential link to infection or immune system trigger. - Prescribe sucralfate  liquid for topical application and gargling to aid healing of oral ulcers. - Continue using topical cream for pain relief. - Allergist referral placed to help determine if this could be a trigger.  - Consider referral to ENT for further evaluation

## 2023-11-21 NOTE — Assessment & Plan Note (Signed)
 Suspected dysautonomia with symptoms of chest pain and heart palpitations. Further testing pending with specialist in [place].

## 2023-11-21 NOTE — Assessment & Plan Note (Signed)
 Pain and indentation at the site of a previous steroid injection, possibly due to localized atrophy or nerve irritation. Pain is intermittent and can be managed with acetaminophen. - Consider ultrasound of the left buttock if pain recurs to assess for fluid collection or nerve involvement.

## 2023-11-21 NOTE — Progress Notes (Signed)
 Laura FORBES Doing, DNP, AGNP-c [facility] [address] [place], [place] [address] [phone]   ACUTE VISIT- ESTABLISHED PATIENT  Blood pressure 120/80, pulse 92, weight 147 lb 3.2 oz (66.8 kg).  Subjective:  HPI Laura Dudley is a 23 y.o. female presents to day for evaluation of multiple acute and chronic concern(s).   She has been experiencing recurrent body aches for the past five weeks. Initially, the aches were mild and localized to her arms but then worsened to involve her entire body. The sensation is described as similar to having a fever, although she denies any actual fever. The body aches subsided after a week but have recently returned, primarily affecting her arms. The current aches are not as severe as before. She also notes a feeling of numbness in the upper extremities that she feels correlates with the aching. She has not experienced this in the past.   Shortly after the aching started, she did have a period of fever, chills, and sore throat. She was seen in the UC and ED and strep was negative. Lab work was negative for tick borne illness.   She experiences recurrent mouth ulcers, presenting a few weeks ago with seven ulcers on her lips and two at the back of her throat, causing significant pain and difficulty eating. The ulcers resolved but have recently recurred, with three on the inside of her bottom lip and one at the back of her throat. She uses a prescribed steroid cream for the ulcers, which provides some relief, but causes her mouth to be dry and stick together. She also performs salt water rinses, which she finds helpful. There is a family history of canker sores, with her mother and grandfather also experiencing them. She cannot think of any triggers for the sores or anything that gets rid of them.   She has a history of IBS and recently experienced severe abdominal pain, leading to an ER visit where she was diagnosed with an IBS flare-up. She was  evaluated for appendicitis and an upper respiratory infection. She was treated for dehydration with IV fluids. She reports waking with right upper quadrant abdominal pain when she has drank liquids at night. This resolves after urination and hydration in the morning.  She recently changed her anti-acid medication from pantoprazole  to famotidine  due to concerns about long-term use. Since the change, she has experienced a return of chest pain, which was previously managed with pantoprazole . She also experienced a 'heart episode' where she felt her heart racing while standing.   She received a steroid injection for poison ivy at the dermatology office, which has resulted in a painful indentation and discoloration at the injection site on her buttocks. The pain is described as an intermittent pain that occasionally intensifies to a 6 or 7 out of 10 on the pain scale. She feels the pain may be shock like in nature. The last day she experienced this pain was Friday. She is not sure what brings this on or what improves it. She has returned to the dermatologist for evaluation but they reassured her this would improve. This was prior to the onset of the pain.   She notes increased use of her inhaler, although she denies wheezing or significant respiratory symptoms.   She reports feeling generally tired, especially during her menstrual period, which has just started.     ROS negative except for what is listed in HPI. History, Medications, Surgery, SDOH, and Family History reviewed and updated as appropriate.  Objective:  Physical Exam Vitals and nursing note reviewed.  Constitutional:      General: She is not in acute distress.    Appearance: Normal appearance. She is normal weight. She is not ill-appearing, toxic-appearing or diaphoretic.  HENT:     Head: Normocephalic.     Right Ear: Tympanic membrane normal.     Left Ear: Tympanic membrane normal.     Nose: Nose normal.     Mouth/Throat:      Lips: Lesions present.     Mouth: Mucous membranes are moist. Oral lesions present.     Pharynx: Oropharynx is clear.     Comments: Ulcerations noted on the inside of the bottom lip and on the posterior oropharynx in various stages of onset to healing.  Eyes:     Conjunctiva/sclera: Conjunctivae normal.  Neck:     Vascular: No carotid bruit.  Cardiovascular:     Rate and Rhythm: Normal rate and regular rhythm.     Pulses: Normal pulses.     Heart sounds: Normal heart sounds.  Pulmonary:     Effort: Pulmonary effort is normal.     Breath sounds: Normal breath sounds. No wheezing.  Abdominal:     General: Bowel sounds are normal. There is no distension.     Palpations: Abdomen is soft.     Tenderness: There is no abdominal tenderness. There is no right CVA tenderness, left CVA tenderness or guarding.  Musculoskeletal:        General: Normal range of motion.     Cervical back: Normal range of motion and neck supple. No tenderness.     Right lower leg: No edema.     Left lower leg: No edema.  Lymphadenopathy:     Cervical: No cervical adenopathy.  Skin:    General: Skin is warm and dry.     Capillary Refill: Capillary refill takes less than 2 seconds.  Neurological:     Mental Status: She is alert and oriented to person, place, and time.     Sensory: No sensory deficit.     Motor: No weakness.     Gait: Gait normal.  Psychiatric:        Mood and Affect: Mood normal.         Assessment & Plan:   Problem List Items Addressed This Visit     Asthma   Increased use of inhaler noted, but lung examination is clear. No wheezing or acute respiratory distress observed. Unclear if symptoms are exacerbated by uncontrolled GERD due to report of inhaler use needed while in bed. Consider restart of PPI if continues. Recommend discuss with GI provider for guidance on recommendations.       Relevant Medications   sucralfate  (CARAFATE ) 1 GM/10ML suspension   Other Relevant Orders   CBC  with Differential/Platelet   Comprehensive metabolic panel with GFR   Vitamin B12   VITAMIN D  25 Hydroxy (Vit-D Deficiency, Fractures)   Folate   Zinc   Ambulatory referral to Allergy   Bowel habit changes   Severe abdominal pain previously evaluated in the ER, attributed to IBS flare-up. No appendicitis or other acute abdominal pathology identified. Symptoms may be exacerbated by stress or dietary factors. - Consider referral to allergist for evaluation of potential food allergies contributing to abdominal pain.      Relevant Medications   sucralfate  (CARAFATE ) 1 GM/10ML suspension   Migraine without aura and without status migrainosus, not intractable   Menstrual-related migraines, consistent with previous patterns.  GERD without esophagitis   Recurrent chest pain, previously managed with pantoprazole , now switched to famotidine . Current medication not providing relief, and chest pain has returned. Consider changing back to PPI. Recommend discussion with GI for recommendations.       Relevant Medications   sucralfate  (CARAFATE ) 1 GM/10ML suspension   Dysautonomia-like disorder   Suspected dysautonomia with symptoms of chest pain and heart palpitations. Further testing pending with specialist in [place].       Recurrent oral ulcers - Primary   Recurrent oral ulcers with significant pain of unknown etiology. Previous treatment with topical steroid cream provided some relief. Has seen dentist with recommendations for chlorhexidine oral rinse. No clear pattern identified, and dietary changes have not prevented recurrence. Potential link to infection or immune system trigger. - Prescribe sucralfate  liquid for topical application and gargling to aid healing of oral ulcers. - Continue using topical cream for pain relief. - Allergist referral placed to help determine if this could be a trigger.  - Consider referral to ENT for further evaluation      Relevant Medications    sucralfate  (CARAFATE ) 1 GM/10ML suspension   Other Relevant Orders   CBC with Differential/Platelet   Comprehensive metabolic panel with GFR   Vitamin B12   VITAMIN D  25 Hydroxy (Vit-D Deficiency, Fractures)   Folate   Zinc   Ambulatory referral to Allergy   Abnormal urinalysis   Repeat UA to determine if this could be a contributor to current symptoms.       Relevant Orders   POCT URINALYSIS DIP (CLINITEK) (Completed)   Urine Culture   Body aches   Intermittent body aches and fatigue, initially accompanied by fever and gastrointestinal symptoms the URI symptoms. Symptoms suggestive of a viral syndrome, possibly a new COVID variant, but no fever currently. Previous lab work showed dehydration and low white blood cell count, possibly related to viral infection. Strep and tick borne illnesses were negative. White counts were decreased on most recent labs. Abdnormality on UA noted at [facility]. Will repeat today.  - Order lab tests to check white blood cell count and rule out infection. - Collect urine sample to check for UTI or other abnormalities.       Left buttock pain   Pain and indentation at the site of a previous steroid injection, possibly due to localized atrophy or nerve irritation. Pain is intermittent and can be managed with acetaminophen. - Consider ultrasound of the left buttock if pain recurs to assess for fluid collection or nerve involvement.      Relevant Orders   US  LT LOWER EXTREM LTD SOFT TISSUE NON VASCULAR   Multiple symptoms with no clear link or etiology create a confusing picture. Unable to clearly identify causes of symptoms. Extensive chart review of previous visits to UC and ED along with labs and notes. At this time, will start with labs to help rule out infection. Previous ANA and celiac negative.   Additional orders for ESR, CRP, TSH, T4, ANA, Complement C3, Complement C4 added for further evaluation to differentiate Autoimmune.Connective Tissue disorders  (lupus, mixed connective tissue disease, behcet's, sjogrens), thyroid  disease.    Laura FORBES Doing, DNP, AGNP-c Time:  77 minutes, >50% spent counseling, care coordination, chart review, and documentation.

## 2023-11-21 NOTE — Assessment & Plan Note (Signed)
 Severe abdominal pain previously evaluated in the ER, attributed to IBS flare-up. No appendicitis or other acute abdominal pathology identified. Symptoms may be exacerbated by stress or dietary factors. - Consider referral to allergist for evaluation of potential food allergies contributing to abdominal pain.

## 2023-11-21 NOTE — Assessment & Plan Note (Signed)
 Intermittent body aches and fatigue, initially accompanied by fever and gastrointestinal symptoms the URI symptoms. Symptoms suggestive of a viral syndrome, possibly a new COVID variant, but no fever currently. Previous lab work showed dehydration and low white blood cell count, possibly related to viral infection. Strep and tick borne illnesses were negative. White counts were decreased on most recent labs. Abdnormality on UA noted at [facility]. Will repeat today.  - Order lab tests to check white blood cell count and rule out infection. - Collect urine sample to check for UTI or other abnormalities.

## 2023-11-23 ENCOUNTER — Ambulatory Visit: Payer: Self-pay

## 2023-11-23 NOTE — Telephone Encounter (Signed)
 FYI Only or Action Required?: Action required by provider: update on patient condition.  Patient was last seen in primary care on 11/21/2023 by Early, Camie BRAVO, NP.  Called Nurse Triage reporting Mouth Lesions.  Symptoms began several weeks ago.  Interventions attempted: Prescription medications: mouthwash from dentist.  Symptoms are: gradually worsening.  Triage Disposition: See PCP When Office is Open (Within 3 Days)  Patient/caregiver understands and will follow disposition?: No, wishes to speak with PCP  Copied from CRM #[id number]. Topic: Clinical - Red Word Triage >> Nov 23, 2023  4:55 PM Selinda RAMAN wrote: Red Word that prompted transfer to Nurse Triage: The patient called in stating she saw her provider on Tuesday for an ulcer in her mouth and she was prescribed sucralfate  (CARAFATE ) 1 GM/10ML suspension. She only picked up the medication yesterday and has not started it yet. She complains of her mouth getting worse with multiple ulcers now including what see described as white pebbles. I will transfer her to E2C2 NT Reason for Disposition  4 or more sores (ulcers)  Answer Assessment - Initial Assessment Questions 1. LOCATION: Where is the mouth sore (ulcer) located?      multiple 2. NUMBER: How many sores are there? :     Multiple  3. SIZE: How large is the sore?  (e.g., size of an apple seed, watermelon seed, pencil eraser)     Multiple 4. PAIN: Are they painful? If Yes, ask: How bad is it?  (Scale 0-10; or none, mild, moderate, severe)      5. ONSET: When did you first notice the sore?      Chronic-intermittent 6. RECURRENT SYMPTOM: Have you had a mouth ulcer before? If Yes, ask: When was the last time? and What happened that time?      Yes 7. CAUSE: What do you think is causing the mouth sore?     Unsure 8. OTHER SYMPTOMS: Do you have any other symptoms? (e.g., fever, swollen lymph node)    Looks like white pebbles on throat. Painful to  swallow  Additional info: Has not used Carafate  at this time, read restrictions on antacids and around meal times. Explained restrictions are for internal ingestion, ok to use as gargle. She will try carafate  liquid when she gets home today. Would you like to refer to ENT and/or allergist? Please advise.  Protocols used: Mouth Ulcers-A-AH

## 2023-11-24 ENCOUNTER — Other Ambulatory Visit: Payer: Self-pay

## 2023-11-24 DIAGNOSIS — K1379 Other lesions of oral mucosa: Secondary | ICD-10-CM

## 2023-11-24 NOTE — Telephone Encounter (Signed)
 Per Catheline, discussed ENT/ Allergist Referrals and patient was notified at her visit of this.

## 2023-12-01 ENCOUNTER — Encounter (INDEPENDENT_AMBULATORY_CARE_PROVIDER_SITE_OTHER): Payer: Self-pay

## 2023-12-01 ENCOUNTER — Ambulatory Visit (HOSPITAL_COMMUNITY)

## 2023-12-04 ENCOUNTER — Ambulatory Visit (HOSPITAL_COMMUNITY)
Admission: RE | Admit: 2023-12-04 | Discharge: 2023-12-04 | Disposition: A | Discharge status: Home / Self Care | Source: Ambulatory Visit | Attending: Nurse Practitioner | Admitting: Nurse Practitioner

## 2023-12-04 ENCOUNTER — Other Ambulatory Visit

## 2023-12-04 DIAGNOSIS — R1084 Generalized abdominal pain: Secondary | ICD-10-CM | POA: Diagnosis not present

## 2023-12-04 DIAGNOSIS — R8290 Unspecified abnormal findings in urine: Secondary | ICD-10-CM | POA: Diagnosis not present

## 2023-12-04 DIAGNOSIS — R52 Pain, unspecified: Secondary | ICD-10-CM | POA: Diagnosis not present

## 2023-12-04 DIAGNOSIS — M7918 Myalgia, other site: Secondary | ICD-10-CM | POA: Diagnosis not present

## 2023-12-04 DIAGNOSIS — M79604 Pain in right leg: Secondary | ICD-10-CM | POA: Diagnosis not present

## 2023-12-04 DIAGNOSIS — J4520 Mild intermittent asthma, uncomplicated: Secondary | ICD-10-CM | POA: Diagnosis not present

## 2023-12-04 DIAGNOSIS — K1379 Other lesions of oral mucosa: Secondary | ICD-10-CM | POA: Diagnosis not present

## 2023-12-05 LAB — C3 COMPLEMENT: Complement C3, Serum: 151 mg/dL (ref 82–167)

## 2023-12-05 LAB — SEDIMENTATION RATE: Sed Rate: 12 mm/h (ref 0–32)

## 2023-12-05 LAB — ANTINUCLEAR ANTIBODIES, IFA: ANA Titer 1: NEGATIVE

## 2023-12-05 LAB — TSH+FREE T4
Free T4: 1.17 ng/dL (ref 0.82–1.77)
TSH: 0.744 u[IU]/mL (ref 0.450–4.500)

## 2023-12-05 LAB — C4 COMPLEMENT: Complement C4, Serum: 31 mg/dL (ref 12–38)

## 2023-12-05 LAB — C-REACTIVE PROTEIN: CRP: 3 mg/L (ref 0–10)

## 2023-12-15 ENCOUNTER — Other Ambulatory Visit: Payer: Self-pay | Admitting: Nurse Practitioner

## 2023-12-15 DIAGNOSIS — K1379 Other lesions of oral mucosa: Secondary | ICD-10-CM

## 2023-12-15 DIAGNOSIS — R1084 Generalized abdominal pain: Secondary | ICD-10-CM

## 2023-12-15 DIAGNOSIS — J4520 Mild intermittent asthma, uncomplicated: Secondary | ICD-10-CM

## 2024-01-10 ENCOUNTER — Other Ambulatory Visit: Payer: Self-pay | Admitting: Nurse Practitioner

## 2024-01-10 DIAGNOSIS — J4520 Mild intermittent asthma, uncomplicated: Secondary | ICD-10-CM

## 2024-01-10 DIAGNOSIS — K1379 Other lesions of oral mucosa: Secondary | ICD-10-CM

## 2024-01-10 DIAGNOSIS — R1084 Generalized abdominal pain: Secondary | ICD-10-CM

## 2024-01-16 ENCOUNTER — Ambulatory Visit (INDEPENDENT_AMBULATORY_CARE_PROVIDER_SITE_OTHER)

## 2024-01-16 ENCOUNTER — Other Ambulatory Visit (INDEPENDENT_AMBULATORY_CARE_PROVIDER_SITE_OTHER): Payer: Self-pay

## 2024-01-16 ENCOUNTER — Encounter (INDEPENDENT_AMBULATORY_CARE_PROVIDER_SITE_OTHER): Payer: Self-pay

## 2024-01-16 VITALS — BP 110/78 | HR 91 | Temp 97.5°F

## 2024-01-16 DIAGNOSIS — K1379 Other lesions of oral mucosa: Secondary | ICD-10-CM

## 2024-01-16 DIAGNOSIS — M26609 Unspecified temporomandibular joint disorder, unspecified side: Secondary | ICD-10-CM

## 2024-01-16 DIAGNOSIS — G4763 Sleep related bruxism: Secondary | ICD-10-CM

## 2024-01-16 MED ORDER — LIDOCAINE VISCOUS HCL 2 % MT SOLN: 5.0000 mL | Freq: Three times a day (TID) | OROMUCOSAL | 2 refills | Status: AC | PRN

## 2024-01-16 NOTE — Progress Notes (Signed)
 Dear Dr. Oris, Here is my assessment for our mutual patient, Laura Dudley. Thank you for allowing me the opportunity to care for your patient. Please do not hesitate to contact me should you have any other questions. Sincerely, Dr. Penne Croak  Otolaryngology Clinic Note Referring provider: Dr. Oris HPI:  Discussed the use of AI scribe software for clinical note transcription with the patient, who gave verbal consent to proceed.  History of Present Illness Laura Dudley is a 23 year old female who presents with persistent and painful oral ulcers.  Oral ulcerations - recurrent painful oral ulcers present for approximately eight years, initially localized to the posterior oropharynx and mistaken for strep throat (with consistently negative strep tests) - Over the past year, ulcers have become more widespread, involving lips, gums, and multiple intraoral sites - Ulcers cause significant pain, especially with eating and speaking - Episodes last over a week and a half; most recent episode began a few days ago - Brief remission of one to two weeks between episodes before recurrence - No specific triggers identified; stress (such as a recent move) may contribute - Dietary modifications, including gluten avoidance due to suspicion of celiac disease, have not resolved symptoms - Various medications and mouthwashes trialed, including a product starting with 'P' that provides temporary pain relief but does not prevent ulcer formation - Dentist recommended mouthwash for canker sores one year ago, which provided some pain relief but did not prevent recurrence  Jaw and ear discomfort - Significant teeth grinding (bruxism) - Discomfort localized to jaw and ear areas - Attempts to use a bite guard at night, but it is frequently displaced by morning - present for several years.  Gastrointestinal symptoms and family history - History of irritable bowel syndrome (IBS) - Mother recently  diagnosed with celiac disease, raising concern for possible genetic predisposition - Multiple tests performed to identify etiology of oral ulcers, with no definitive diagnosis  Oral hygiene practices - Brushes teeth twice daily - Occasional flossing - Regular use of mouthwash - has not seen a dentist in the last year.  PMH/Meds/All/SocHx/FamHx/ROS:   Past Medical History:  Diagnosis Date   ADHD 02/17/2020   Aphthous ulcer of mouth 07/10/2023   Asthma    Body mass index (BMI) of 22.0 to 22.9 in adult 08/12/2021   Dysautonomia (HCC)    H/O pyloric stenosis    IBS (irritable bowel syndrome)    Non-recurrent acute suppurative otitis media of left ear without spontaneous rupture of tympanic membrane 07/10/2023   Vitamin D  deficiency 12/31/2019     Past Surgical History:  Procedure Laterality Date   pyloric stenosis repair      Family History  Problem Relation Age of Onset   Asthma Mother    Migraines Mother    Gout Father    Bipolar disorder Maternal Grandmother    Depression Maternal Grandmother    Melanoma Paternal Grandfather    Colon cancer Neg Hx    Stomach cancer Neg Hx      Social Connections: Moderately Isolated (07/06/2023)   Social Connection and Isolation Panel    Frequency of Communication with Friends and Family: Once a week    Frequency of Social Gatherings with Friends and Family: Once a week    Attends Religious Services: 1 to 4 times per year    Active Member of [employer] or Organizations: No    Attends Banker Meetings: Not on file    Marital Status: Living with partner  Current Outpatient Medications:    magic mouthwash (lidocaine, diphenhydrAMINE, alum & mag hydroxide) suspension, Swish and spit 5 mLs 3 (three) times daily as needed for mouth pain., Disp: 360 mL, Rfl: 2   albuterol  (VENTOLIN  HFA) 108 (90 Base) MCG/ACT inhaler, Inhale 2 puffs into the lungs every 6 (six) hours as needed for wheezing or shortness of breath., Disp: 8 g,  Rfl: 3   Albuterol -Budesonide (AIRSUPRA ) 90-80 MCG/ACT AERO, Inhale 2 Inhalations into the lungs every 6 (six) hours as needed., Disp: 17.7 g, Rfl: 3   Cholecalciferol (VITAMIN D3) 1.25 MG (50000 UT) CAPS, TAKE 1 CAPSULE BY MOUTH ONE TIME PER WEEK, Disp: 12 capsule, Rfl: 3   cyproheptadine  (PERIACTIN ) 4 MG tablet, TAKE 1 TABLET BY MOUTH AT BEDTIME., Disp: 30 tablet, Rfl: 3   EPINEPHrine  (EPIPEN  2-PAK) 0.3 mg/0.3 mL IJ SOAJ injection, Inject 0.3 mg into the muscle as needed for anaphylaxis. Call 911 immediately., Disp: 2 each, Rfl: 2   famotidine  (PEPCID ) 40 MG tablet, Take 1 tablet (40 mg total) by mouth daily., Disp: 30 tablet, Rfl: 3   fluocinonide cream (LIDEX) 0.05 %, Apply 1 Application topically 2 (two) times daily., Disp: , Rfl:    loratadine (CLARITIN) 10 MG tablet, Take 10 mg by mouth daily., Disp: , Rfl:    metroNIDAZOLE (METROCREAM) 0.75 % cream, Apply 1 Application topically 2 (two) times daily., Disp: , Rfl:    nystatin-triamcinolone  ointment (MYCOLOG), Apply 1 Application topically 2 (two) times daily., Disp: , Rfl:    pantoprazole  (PROTONIX ) 40 MG tablet, TAKE 1 TABLET BY MOUTH EVERY DAY, Disp: 30 tablet, Rfl: 2   sucralfate  (CARAFATE ) 1 GM/10ML suspension, APPLY TO ULCERS AND GARGLE WITH SMALL AMOUNT FOR ULCERATIONS IN THE BACK OF THE THROAT 4 TIMES A DAY., Disp: 1260 mL, Rfl: 1   triamcinolone  (KENALOG ) 0.1 % paste, Use as directed 1 Application in the mouth or throat 2 (two) times daily. (Patient not taking: Reported on 11/21/2023), Disp: 5 g, Rfl: 1   Physical Exam:   BP 110/78   Pulse 91   Temp (!) 97.5 F (36.4 C)   SpO2 96%   The patient was awake, alert, and appropriate. The external ears were inspected, and otoscopy was performed to evaluate the external auditory canals and tympanic membranes. The nasal cavity and septum were examined for mucosal changes, obstruction, or discharge. The oral cavity and oropharynx were inspected for mucosal lesions, infection, or tonsillar  hypertrophy. The neck was palpated for lymphadenopathy, thyroid  abnormalities, or other masses. Cranial nerve function was grossly intact.  Pertinent Findings: Physical Exam HEENT: Oral ulcers present - posterior to right upper molar, painful. Otherwise lesion free. Tonsils 1+ EAC clear, normal TM landmarks Good ROM tongue. Neck soft and without lymphadenoapthy  Seprately Identifiable Procedures:  I personally ordered, reviewed and interpreted the following with the patient today  Prior to initiating any procedures, risks/benefits/alternatives were explained to the patient and verbal consent obtained. None  Impression & Plans:  Senia Even is a 23 y.o. female  1. Bruxism, sleep-related   2. TMJ dysfunction   3. Recurrent oral ulcers     - Findings and diagnoses discussed in detail with the patient. - Risks, benefits, and alternatives were reviewed. Through shared decision making, the patient elects to proceed with below. Assessment & Plan Recurrent oral aphthous ulcers Chronic oral ulcers with inconclusive previous tests. Possible triggers include stress and dietary factors. Significant pain impacting eating and speaking. - Use [facility] alcohol-free mouthwash two to three  times daily. - Brush teeth after every meal and floss nightly. - Keep a food diary to identify potential dietary triggers. - Consider biopsy of ulcers if no improvement in two months. - Follow-up with a dentist for routine cleaning and evaluation.  Bruxism (teeth grinding) Significant teeth grinding during ulcer episodes causing jaw pain and ear discomfort. Previous bite guard use unsuccessful. Potential consideration for Botox injections in jaw muscles. Insurance coverage challenges for Botox treatment discussed. - Attempt to use a bite guard nightly, even if only for a few hours. - Consider referral to [facility] in [place] for evaluation and potential Botox treatment.  - Orders placed: No  orders of the defined types were placed in this encounter.  - Medications prescribed/continued/adjusted:  Meds ordered this encounter  Medications   magic mouthwash (lidocaine, diphenhydrAMINE, alum & mag hydroxide) suspension    Sig: Swish and spit 5 mLs 3 (three) times daily as needed for mouth pain.    Dispense:  360 mL    Refill:  2   - Education materials provided to the patient. - Follow up: 2 months. Patient instructed to return sooner or go to the ED if new/worsening symptoms develop.   Thank you for allowing me the opportunity to care for your patient. Please do not hesitate to contact me should you have any other questions.  Sincerely, Penne Croak, DO Otolaryngologist (ENT) [facility] Phone: [phone] Fax: [phone]  01/16/2024, 5:17 PM

## 2024-01-29 ENCOUNTER — Encounter: Payer: Self-pay | Admitting: Pediatrics

## 2024-01-29 ENCOUNTER — Ambulatory Visit: Admitting: Nurse Practitioner

## 2024-01-29 VITALS — BP 114/74 | HR 68 | Wt 146.4 lb

## 2024-01-29 DIAGNOSIS — E881 Lipodystrophy, unspecified: Secondary | ICD-10-CM | POA: Diagnosis not present

## 2024-01-29 DIAGNOSIS — J4540 Moderate persistent asthma, uncomplicated: Secondary | ICD-10-CM

## 2024-01-29 DIAGNOSIS — L819 Disorder of pigmentation, unspecified: Secondary | ICD-10-CM

## 2024-01-29 MED ORDER — TRETINOIN 0.01 % EX GEL
Freq: Every day | CUTANEOUS | 3 refills | Status: AC
Start: 2024-01-29 — End: ?

## 2024-01-29 NOTE — Patient Instructions (Addendum)
 Mycolog-Triamcinolone : This is for a fungal infection with itching. Usually a rash that itches and is in a warm, dark, moist location like under the arms, on the feet, etc.   fluocinonide cream - steroid cream. Usually when you have an itchy rash like eczema.   Triamcinolone - this is a steroid paste for the ulcers of the mouth.   I have sent a cream called tretinoin to try on the discoloration of the skin. You can also use this on your face, if you would like.   I have sent a referral for a breathing test.

## 2024-01-29 NOTE — Assessment & Plan Note (Signed)
 Fat atrophy of the left buttock following a steroid injection in June, characterized by a dent, discoloration, and intermittent sharp pain. Likely due to a shallow injection causing fat dissolution. She is concerned about the dent enlarging. - Prescribe tretinoin cream to apply a thin layer once daily to address discoloration. - Advise that the condition may improve spontaneously over 1-2 years. - Discuss the possibility of fillers with a dermatologist if the condition persists.

## 2024-01-29 NOTE — Assessment & Plan Note (Signed)
 Asthma with increased symptoms of breathlessness and chest heaviness, especially after moving to a new house with stairs. She reports being winded after minimal exertion, such as climbing stairs, despite using an inhaler. Prefers the new inhaler over the previous one. Does not feel the changes are related to deconditioning.  - Recommend spirometry to assess lung function and determine the cause of increased breathlessness. - Continue using the current inhaler as needed.

## 2024-01-29 NOTE — Progress Notes (Signed)
 Laura Doing, DNP, AGNP-c [facility]  [address] [place], [place] [address] [phone]  ESTABLISHED PATIENT- Chronic Health and/or Follow-Up Visit on 01/29/2024  Blood pressure 114/74, pulse 68, weight 146 lb 6.4 oz (66.4 kg), last menstrual period 01/19/2024.   HPI: History of Present Illness Laura Dudley is a 23 year old female who presents with worsening fat atrophy following a steroid injection.  She has been experiencing a worsening condition of a 'dimple area' on her left buttock, initially appearing as a reddish rash and now resembling a bruise. The area does not hurt upon touch but occasionally causes sharp, stabbing pain without any apparent trigger. The dent has become larger over time, which is concerning to her. The issue began after a steroid injection on September 15, 2023, which was initially followed by a rash. The discoloration has not changed significantly, and the pain is intermittent and sharp, localized to the same spot without radiation.  She mentions a similar dent on her arm from a previous incident on December 10, 2023, which she attributes to a bruise from a carbohydrate falling on her. This dent is smaller but palpable.  She is currently using an inhaler for asthma, which she finds effective, but she experiences increased shortness of breath with activities such as climbing stairs or walking uphill at her new residence. She uses the inhaler before playing tennis, which helps manage her symptoms.  She has a history of mouth ulcers, which she is managing with increased oral hygiene. The ulcers are thought to be related to gastrointestinal issues, and she is scheduled for a follow-up in a month. She is also taking vitamin D  supplements and a probiotic to address bloating and gut health issues.  She recently moved to [facility], where she lives near her fianc's family.  2.5cm w x 2 cm t on right buttock. Color change noted.    All ROS negative  with exception of what is listed above.    PHYSICAL EXAM Physical Exam Vitals and nursing note reviewed.  Constitutional:      Appearance: Normal appearance.  Musculoskeletal:        General: Normal range of motion.  Skin:    General: Skin is warm and dry.      Neurological:     General: No focal deficit present.     Mental Status: She is alert and oriented to person, place, and time.  Psychiatric:        Mood and Affect: Mood normal.        Behavior: Behavior normal.     PLAN Problem List Items Addressed This Visit     Asthma   Asthma with increased symptoms of breathlessness and chest heaviness, especially after moving to a new house with stairs. She reports being winded after minimal exertion, such as climbing stairs, despite using an inhaler. Prefers the new inhaler over the previous one. Does not feel the changes are related to deconditioning.  - Recommend spirometry to assess lung function and determine the cause of increased breathlessness. - Continue using the current inhaler as needed.       Relevant Orders   Ambulatory referral to Pulmonology   Lipodystrophy - Primary   Fat atrophy of the left buttock following a steroid injection in June, characterized by a dent, discoloration, and intermittent sharp pain. Likely due to a shallow injection causing fat dissolution. She is concerned about the dent enlarging. - Prescribe tretinoin cream to apply a thin layer once daily to address  discoloration. - Advise that the condition may improve spontaneously over 1-2 years. - Discuss the possibility of fillers with a dermatologist if the condition persists.       Discoloration of skin   Relevant Medications   tretinoin (RETIN-A) 0.01 % gel       Return if symptoms worsen or fail to improve.  Laura Milah Recht, DNP, AGNP-c Time: 35 minutes, >50% spent counseling, care coordination, chart review, and documentation.  SABRAtime

## 2024-01-31 ENCOUNTER — Ambulatory Visit: Admitting: Family Medicine

## 2024-02-15 ENCOUNTER — Other Ambulatory Visit: Payer: Self-pay | Admitting: Nurse Practitioner

## 2024-02-15 ENCOUNTER — Other Ambulatory Visit: Payer: Self-pay | Admitting: Pediatrics

## 2024-02-22 ENCOUNTER — Encounter: Payer: Self-pay | Admitting: Nurse Practitioner

## 2024-02-22 ENCOUNTER — Ambulatory Visit: Payer: Self-pay | Admitting: Pulmonary Disease

## 2024-02-22 ENCOUNTER — Ambulatory Visit: Admitting: Pulmonary Disease

## 2024-02-22 ENCOUNTER — Encounter: Payer: Self-pay | Admitting: Pulmonary Disease

## 2024-02-22 ENCOUNTER — Ambulatory Visit

## 2024-02-22 VITALS — BP 116/79 | HR 93 | Temp 97.8°F | Ht 64.0 in | Wt 148.0 lb

## 2024-02-22 DIAGNOSIS — J45909 Unspecified asthma, uncomplicated: Secondary | ICD-10-CM

## 2024-02-22 DIAGNOSIS — R918 Other nonspecific abnormal finding of lung field: Secondary | ICD-10-CM | POA: Diagnosis not present

## 2024-02-22 LAB — CBC WITH DIFFERENTIAL/PLATELET
Basophils Absolute: 0 K/uL (ref 0.0–0.1)
Basophils Relative: 0.5 % (ref 0.0–3.0)
Eosinophils Absolute: 0 K/uL (ref 0.0–0.7)
Eosinophils Relative: 1.2 % (ref 0.0–5.0)
HCT: 38.5 % (ref 36.0–46.0)
Hemoglobin: 12.9 g/dL (ref 12.0–15.0)
Lymphocytes Relative: 28.5 % (ref 12.0–46.0)
Lymphs Abs: 1 K/uL (ref 0.7–4.0)
MCHC: 33.3 g/dL (ref 30.0–36.0)
MCV: 85.1 fl (ref 78.0–100.0)
Monocytes Absolute: 0.2 K/uL (ref 0.1–1.0)
Monocytes Relative: 6.7 % (ref 3.0–12.0)
Neutro Abs: 2.3 K/uL (ref 1.4–7.7)
Neutrophils Relative %: 63.1 % (ref 43.0–77.0)
Platelets: 194 K/uL (ref 150.0–400.0)
RBC: 4.53 Mil/uL (ref 3.87–5.11)
RDW: 12.4 % (ref 11.5–15.5)
WBC: 3.6 K/uL — ABNORMAL LOW (ref 4.0–10.5)

## 2024-02-22 LAB — POCT EXHALED NITRIC OXIDE: FeNO level (ppb): 7

## 2024-02-22 MED ORDER — BUDESONIDE-FORMOTEROL FUMARATE 160-4.5 MCG/ACT IN AERO: 2.0000 | INHALATION_SPRAY | Freq: Two times a day (BID) | RESPIRATORY_TRACT | 12 refills | Status: AC

## 2024-02-22 NOTE — Telephone Encounter (Signed)
 Please advise on lab results.

## 2024-02-22 NOTE — Progress Notes (Unsigned)
 Laura Dudley    [id number]    2000/12/02  Primary Care Physician:Early, Camie BRAVO, NP  Referring Physician: Oris Camie BRAVO, NP [address] [place],  [place] [address]  Chief complaint: Consult for asthma  HPI: 23 y.o. who  has a past medical history of ADHD (02/17/2020), Aphthous ulcer of mouth (07/10/2023), Asthma, Body mass index (BMI) of 22.0 to 22.9 in adult (08/12/2021), Dysautonomia (HCC), H/O pyloric stenosis, IBS (irritable bowel syndrome), Non-recurrent acute suppurative otitis media of left ear without spontaneous rupture of tympanic membrane (07/10/2023), and Vitamin D  deficiency (12/31/2019).  Discussed the use of AI scribe software for clinical note transcription with the patient, who gave verbal consent to proceed. History of Present Illness Laura Dudley is a 23 year old female with asthma who presents with worsening breathing difficulties.  Dyspnea and chest tightness - Severe shortness of breath and chest tightness, described as 'an elephant sitting on my chest' - Symptoms occur unpredictably, sometimes with minimal exertion such as getting out of bed, while at other times able to walk at work without issues - No associated wheezing or chest congestion during these episodes - Symptoms differ from typical asthma attacks  Asthma management and exacerbations - Uses albuterol  inhaler a few times per week, especially with physical activity such as playing tennis or working in the shop at her job - Previously used a daily controller medication during high school - Mother has severe asthma  Environmental and allergen exposure - Experiences seasonal allergies  Other medical history - History of workup for dysautonomia due to episodes of increased heart rate - Irritable bowel syndrome (IBS) - Attention deficit hyperactivity disorder (ADHD) - Migraines  Relevant Pulmonary history: Pets:Has two cats at home Occupation: Works as a regulatory affairs officer for  [employer] Exposures:Recently moved to a new house in [place] after living in a rental with mold issues for three years.  No hot tub, Jacuzzi.  No feather pillows or comforters No h/o chemo/XRT/amiodarone/macrodantin/MTX  No exposure to asbestos, silica or other organic allergens  Smoking history:No tobacco or vape use. Rare alcohol consumption Travel history: Moved from [place]  around 2020 Family history: Mother had asthma   Outpatient Encounter Medications as of 02/22/2024  Medication Sig   Albuterol -Budesonide (AIRSUPRA ) 90-80 MCG/ACT AERO Inhale 2 Inhalations into the lungs every 6 (six) hours as needed.   Cholecalciferol (VITAMIN D3) 1.25 MG (50000 UT) CAPS TAKE 1 CAPSULE BY MOUTH ONE TIME PER WEEK   EPINEPHrine  (EPIPEN  2-PAK) 0.3 mg/0.3 mL IJ SOAJ injection Inject 0.3 mg into the muscle as needed for anaphylaxis. Call 911 immediately.   famotidine  (PEPCID ) 40 MG tablet Take 1 tablet (40 mg total) by mouth daily.   loratadine (CLARITIN) 10 MG tablet Take 10 mg by mouth daily.   albuterol  (VENTOLIN  HFA) 108 (90 Base) MCG/ACT inhaler Inhale 2 puffs into the lungs every 6 (six) hours as needed for wheezing or shortness of breath. (Patient not taking: Reported on 02/22/2024)   amitriptyline  (ELAVIL ) 10 MG tablet TAKE 1 TABLET BY MOUTH EVERYDAY AT BEDTIME   cyproheptadine  (PERIACTIN ) 4 MG tablet TAKE 1 TABLET BY MOUTH AT BEDTIME. (Patient not taking: Reported on 02/22/2024)   fluocinonide cream (LIDEX) 0.05 % Apply 1 Application topically 2 (two) times daily. (Patient not taking: Reported on 01/29/2024)   magic mouthwash (lidocaine, diphenhydrAMINE, alum & mag hydroxide) suspension Swish and spit 5 mLs 3 (three) times daily as needed for mouth pain. (Patient not taking: Reported  on 02/22/2024)   metroNIDAZOLE (METROCREAM) 0.75 % cream Apply 1 Application topically 2 (two) times daily. (Patient not taking: Reported on 02/22/2024)   nystatin-triamcinolone  ointment (MYCOLOG) Apply 1 Application  topically 2 (two) times daily. (Patient not taking: Reported on 02/22/2024)   pantoprazole  (PROTONIX ) 40 MG tablet TAKE 1 TABLET BY MOUTH EVERY DAY   sucralfate  (CARAFATE ) 1 GM/10ML suspension APPLY TO ULCERS AND GARGLE WITH SMALL AMOUNT FOR ULCERATIONS IN THE BACK OF THE THROAT 4 TIMES A DAY. (Patient not taking: Reported on 02/22/2024)   tretinoin (RETIN-A) 0.01 % gel Apply topically at bedtime. For acne and skin discoloration. If used on the face, wear sunscreen. (Patient not taking: Reported on 02/22/2024)   triamcinolone  (KENALOG ) 0.1 % paste Use as directed 1 Application in the mouth or throat 2 (two) times daily. (Patient not taking: Reported on 02/22/2024)   No facility-administered encounter medications on file as of 02/22/2024.     Physical Exam: Today's Vitals   02/22/24 0823  BP: 116/79  Pulse: 93  Temp: 97.8 F (36.6 C)  TempSrc: Oral  SpO2: 97%  Weight: 148 lb (67.1 kg)  Height: 5' 4 (1.626 m)   Body mass index is 25.4 kg/m.  Physical Exam GEN: No acute distress. CV: Regular rate and rhythm, no murmurs. LUNGS: Clear to auscultation bilaterally, normal respiratory effort. SKIN JOINTS: Warm and dry, no rash.    Data Reviewed: Imaging:  PFTs:  FeNO 02/22/2024- 7  Labs:  Assessment and Plan Assessment & Plan Asthma with atypical symptoms Asthma with atypical symptoms, including chest tightness and dyspnea without wheezing. Symptoms have worsened over the past two to three years. Current inhaler use is as needed, primarily for exercise-induced symptoms. FENO test is normal, indicating no allergic or eosinophilic inflammation. Differential diagnosis includes non-allergic asthma or other causes of dyspnea. Presentation is atypical for asthma, but asthma remains a likely diagnosis. - Ordered chest x-ray and blood tests to evaluate for eosinophilia and IgE elevation - Prescribed Symbicort, two puffs in the morning and two puffs in the evening, as a controller  medication. - Advised to continue using current Airsupra  rescue inhaler as needed. - Scheduled lung function test for follow-up visit. - Sent prescription to pharmacy and discussed potential insurance coverage issues.  Recommendations: Chest x-ray, CBC with differential, IgE PFTs and follow-up Start Symbicort Continue Airsupra   Lonna Coder MD [facility] 02/22/2024, 8:28 AM  CC: Early, Camie BRAVO, NP

## 2024-02-22 NOTE — Patient Instructions (Signed)
  VISIT SUMMARY: You visited us  today due to worsening breathing difficulties and chest tightness. We discussed your asthma management and noted that your symptoms have been atypical and worsening over the past few years.  YOUR PLAN: ASTHMA WITH ATYPICAL SYMPTOMS: You have been experiencing severe shortness of breath and chest tightness without wheezing, which is different from your usual asthma symptoms. -We have ordered a chest x-ray and blood tests to check for other types of inflammation. -You are prescribed Symbicort, two puffs in the morning and two puffs in the evening, as a controller medication. -Continue using your current rescue inhaler as needed. -We have scheduled a lung function test for your follow-up visit. -Your prescription has been sent to the pharmacy, and we discussed potential insurance coverage issues.

## 2024-02-23 LAB — IGE: IgE (Immunoglobulin E), Serum: 10 kU/L (ref <=114)

## 2024-03-06 DIAGNOSIS — H6992 Unspecified Eustachian tube disorder, left ear: Secondary | ICD-10-CM | POA: Diagnosis not present

## 2024-03-06 DIAGNOSIS — K219 Gastro-esophageal reflux disease without esophagitis: Secondary | ICD-10-CM | POA: Diagnosis not present

## 2024-03-06 DIAGNOSIS — H9202 Otalgia, left ear: Secondary | ICD-10-CM | POA: Diagnosis not present

## 2024-03-12 DIAGNOSIS — D485 Neoplasm of uncertain behavior of skin: Secondary | ICD-10-CM | POA: Diagnosis not present

## 2024-03-12 DIAGNOSIS — D225 Melanocytic nevi of trunk: Secondary | ICD-10-CM | POA: Diagnosis not present

## 2024-03-12 DIAGNOSIS — L814 Other melanin hyperpigmentation: Secondary | ICD-10-CM | POA: Diagnosis not present

## 2024-03-12 DIAGNOSIS — L821 Other seborrheic keratosis: Secondary | ICD-10-CM | POA: Diagnosis not present

## 2024-03-12 DIAGNOSIS — D1801 Hemangioma of skin and subcutaneous tissue: Secondary | ICD-10-CM | POA: Diagnosis not present

## 2024-03-12 DIAGNOSIS — L218 Other seborrheic dermatitis: Secondary | ICD-10-CM | POA: Diagnosis not present

## 2024-03-19 ENCOUNTER — Other Ambulatory Visit: Payer: Self-pay | Admitting: Pediatrics

## 2024-03-20 ENCOUNTER — Encounter (INDEPENDENT_AMBULATORY_CARE_PROVIDER_SITE_OTHER): Payer: Self-pay

## 2024-03-20 ENCOUNTER — Ambulatory Visit (INDEPENDENT_AMBULATORY_CARE_PROVIDER_SITE_OTHER)

## 2024-03-20 VITALS — BP 112/76 | HR 84 | Temp 98.0°F | Wt 148.0 lb

## 2024-03-20 DIAGNOSIS — M2669 Other specified disorders of temporomandibular joint: Secondary | ICD-10-CM | POA: Diagnosis not present

## 2024-03-20 DIAGNOSIS — G4763 Sleep related bruxism: Secondary | ICD-10-CM | POA: Diagnosis not present

## 2024-03-20 DIAGNOSIS — M26609 Unspecified temporomandibular joint disorder, unspecified side: Secondary | ICD-10-CM | POA: Diagnosis not present

## 2024-03-20 DIAGNOSIS — H9203 Otalgia, bilateral: Secondary | ICD-10-CM

## 2024-03-20 DIAGNOSIS — K1379 Other lesions of oral mucosa: Secondary | ICD-10-CM | POA: Diagnosis not present

## 2024-03-20 NOTE — Progress Notes (Signed)
 Dear Dr. Oris, Here is my assessment for our mutual patient, Laura Dudley. Thank you for allowing me the opportunity to care for your patient. Please do not hesitate to contact me should you have any other questions. Sincerely, Dr. Penne Croak  Otolaryngology Clinic Note Referring provider: Dr. Oris HPI:  Discussed the use of AI scribe software for clinical note transcription with the patient, who gave verbal consent to proceed.  History of Present Illness Laura Dudley is a 23 year old female who presents with recurrent oral ulcers and ear pain. She is accompanied by her fianc.  Oral ulceration and jaw symptoms - Recurrent oral ulcers for approximately ten years. - Most recent episode involved three ulcers: one on the lip and two on the side of the cheek. - Uses a mouth guard to prevent bruxism, but it occasionally falls out. - Recently experienced jaw locking after removing the mouth guard, which resolved after massaging for twenty minutes. - Pain managed with 'magic mouthwash' and Tylenol. - Maintains oral hygiene by brushing teeth after meals and using mouthwash three times daily. - No recent dental cleaning due to availability issues.  Otalgia and hearing changes - Ear pain and discomfort with three ear infections this year, which is unusual for her. - Pain alternates between ears. - No fluid discharge from the ears. - Episodes of hearing changes, including sensation of needing to pop ears and occasional discomfort. - Two weeks ago, visited urgent care and was advised to use Flonase, but has not noticed significant improvement. - Concerned about flying next month due to ear pain.  Respiratory symptoms and medication access - Recently visited pulmonologist for a new inhaler. - Insurance did not cover the prescribed inhaler due to cost. - Seeking a more affordable alternative.  PMH/Meds/All/SocHx/FamHx/ROS:   Past Medical History:  Diagnosis Date   ADHD 02/17/2020    Aphthous ulcer of mouth 07/10/2023   Asthma    Body mass index (BMI) of 22.0 to 22.9 in adult 08/12/2021   Dysautonomia (HCC)    H/O pyloric stenosis    IBS (irritable bowel syndrome)    Non-recurrent acute suppurative otitis media of left ear without spontaneous rupture of tympanic membrane 07/10/2023   Vitamin D  deficiency 12/31/2019     Past Surgical History:  Procedure Laterality Date   pyloric stenosis repair      Family History  Problem Relation Age of Onset   Asthma Mother    Migraines Mother    Gout Father    Bipolar disorder Maternal Grandmother    Depression Maternal Grandmother    Melanoma Paternal Grandfather    Colon cancer Neg Hx    Stomach cancer Neg Hx      Social Connections: Moderately Integrated (01/29/2024)   Social Connection and Isolation Panel    Frequency of Communication with Friends and Family: More than three times a week    Frequency of Social Gatherings with Friends and Family: Twice a week    Attends Religious Services: More than 4 times per year    Active Member of [employer] or Organizations: No    Attends Engineer, Structural: Not on file    Marital Status: Living with partner      Current Outpatient Medications:    acetaminophen-codeine (TYLENOL #3) 300-30 MG tablet, Take 1 tablet by mouth every 4 (four) hours as needed., Disp: , Rfl:    Albuterol -Budesonide  (AIRSUPRA ) 90-80 MCG/ACT AERO, Inhale 2 Inhalations into the lungs every 6 (six) hours as needed., Disp:  17.7 g, Rfl: 3   amitriptyline  (ELAVIL ) 10 MG tablet, TAKE 1 TABLET BY MOUTH EVERYDAY AT BEDTIME, Disp: 90 tablet, Rfl: 1   Azelaic Acid 15 % gel, Apply topically at bedtime., Disp: , Rfl:    budesonide -formoterol  (SYMBICORT ) 160-4.5 MCG/ACT inhaler, Inhale 2 puffs into the lungs 2 (two) times daily., Disp: 1 each, Rfl: 12   Cholecalciferol (VITAMIN D3) 1.25 MG (50000 UT) CAPS, TAKE 1 CAPSULE BY MOUTH ONE TIME PER WEEK, Disp: 12 capsule, Rfl: 3   EPINEPHrine  (EPIPEN  2-PAK)  0.3 mg/0.3 mL IJ SOAJ injection, Inject 0.3 mg into the muscle as needed for anaphylaxis. Call 911 immediately., Disp: 2 each, Rfl: 2   famotidine  (PEPCID ) 40 MG tablet, Take 1 tablet (40 mg total) by mouth daily., Disp: 30 tablet, Rfl: 3   ketoconazole (NIZORAL) 2 % shampoo, Apply topically., Disp: , Rfl:    loratadine (CLARITIN) 10 MG tablet, Take 10 mg by mouth daily., Disp: , Rfl:    mometasone  (ELOCON ) 0.1 % cream, SMARTSIG:sparingly Topical Twice Daily, Disp: , Rfl:    sucralfate  (CARAFATE ) 1 GM/10ML suspension, APPLY TO ULCERS AND GARGLE WITH SMALL AMOUNT FOR ULCERATIONS IN THE BACK OF THE THROAT 4 TIMES A DAY., Disp: 1260 mL, Rfl: 1   albuterol  (VENTOLIN  HFA) 108 (90 Base) MCG/ACT inhaler, Inhale 2 puffs into the lungs every 6 (six) hours as needed for wheezing or shortness of breath. (Patient not taking: Reported on 03/20/2024), Disp: 8 g, Rfl: 3   cyproheptadine  (PERIACTIN ) 4 MG tablet, TAKE 1 TABLET BY MOUTH AT BEDTIME. (Patient not taking: Reported on 03/20/2024), Disp: 30 tablet, Rfl: 3   fluocinonide cream (LIDEX) 0.05 %, Apply 1 Application topically 2 (two) times daily. (Patient not taking: Reported on 03/20/2024), Disp: , Rfl:    magic mouthwash (lidocaine , diphenhydrAMINE, alum & mag hydroxide) suspension, Swish and spit 5 mLs 3 (three) times daily as needed for mouth pain. (Patient not taking: Reported on 03/20/2024), Disp: 360 mL, Rfl: 2   metroNIDAZOLE (METROCREAM) 0.75 % cream, Apply 1 Application topically 2 (two) times daily. (Patient not taking: Reported on 03/20/2024), Disp: , Rfl:    nystatin-triamcinolone  ointment (MYCOLOG), Apply 1 Application topically 2 (two) times daily. (Patient not taking: Reported on 03/20/2024), Disp: , Rfl:    tretinoin  (RETIN-A ) 0.01 % gel, Apply topically at bedtime. For acne and skin discoloration. If used on the face, wear sunscreen. (Patient not taking: Reported on 03/20/2024), Disp: 45 g, Rfl: 3   triamcinolone  (KENALOG ) 0.1 % paste, Use as  directed 1 Application in the mouth or throat 2 (two) times daily. (Patient not taking: Reported on 03/20/2024), Disp: 5 g, Rfl: 1   Physical Exam:   BP 112/76 (BP Location: Left Arm, Patient Position: Sitting, Cuff Size: Normal)   Pulse 84   Temp 98 F (36.7 C)   Wt 148 lb (67.1 kg)   SpO2 97%   BMI 25.40 kg/m   The patient was awake, alert, and appropriate. The external ears were inspected, and otoscopy was performed to evaluate the external auditory canals and tympanic membranes. The nasal cavity and septum were examined for mucosal changes, obstruction, or discharge. The oral cavity and oropharynx were inspected for mucosal lesions, infection, or tonsillar hypertrophy. The neck was palpated for lymphadenopathy, thyroid  abnormalities, or other masses. Cranial nerve function was grossly intact.  Pertinent Findings: Physical Exam HEENT: Atraumatic, normocephalic. External ears with no lesions. Tympanic membrane with normal landmarks. Osteoma in left ear, small and normal- medial superior and anterior. Oral cavity  examined, - right upper red lip with 2mm biopsy, right lower buccal alveolus with 2mm ulceration, good dentition.  Tenderness along bilateal TMJ with crepitus Neck soft with FROM.  Impression & Plans:  Shaylee Stanislawski is a 23 y.o. female  1. Bruxism, sleep-related   2. TMJ dysfunction   3. Recurrent oral ulcers   4. Temporomandibular joint sounds on opening and/or closing the jaw    - Findings and diagnoses discussed in detail with the patient. - Risks, benefits, and alternatives were reviewed. Through shared decision making, the patient elects to proceed with below. 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 Previous treatment with magic mouthwash provided limited relief. - Schedule biopsy if ulcers persist. - continue magic mouthwash - Ensure dental cleaning is scheduled.   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 Previous discussion of Botox treatment. She is open to trying Botox after a successful case in the office. - Proceed with Botox treatment for TMJ dysfunction. - Worsening despite conservative measures - nightly bite guard, massage, warm compresses, and softer foods. - continue bite guard  Ear pain and hearing changes Intermittent ear pain and hearing changes. No fluid observed in ears. Hearing test recommended. - Scheduled hearing test to assess ear function.  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 - Documented osteoma in medical chart. - monitor - consider CT scan if symptoms do not improve with botox  - Orders placed: No orders of the defined types were placed in this encounter.  - Medications prescribed/continued/adjusted: No orders of the defined types were placed in this encounter.  - Education materials provided to the patient. - Follow up: Schedule biopsy at earliest convenience. Botox per insurance. Discuss imagingPatient instructed to return sooner or go to the ED if new/worsening symptoms develop.   Thank you for allowing me the opportunity to care for your patient. Please do not hesitate to contact me should you have any other questions.  Sincerely, Penne Croak, DO Otolaryngologist (ENT) [facility] Phone: [phone] Fax: [phone]  03/20/2024, 9:15 PM

## 2024-03-22 ENCOUNTER — Other Ambulatory Visit: Payer: Self-pay | Admitting: Pediatrics

## 2024-03-26 ENCOUNTER — Other Ambulatory Visit

## 2024-03-26 DIAGNOSIS — J45909 Unspecified asthma, uncomplicated: Secondary | ICD-10-CM | POA: Diagnosis not present

## 2024-03-26 LAB — CBC WITH DIFFERENTIAL/PLATELET
Basophils Absolute: 0 K/uL (ref 0.0–0.1)
Basophils Relative: 0.3 % (ref 0.0–3.0)
Eosinophils Absolute: 0 K/uL (ref 0.0–0.7)
Eosinophils Relative: 0.9 % (ref 0.0–5.0)
HCT: 37.7 % (ref 36.0–46.0)
Hemoglobin: 12.7 g/dL (ref 12.0–15.0)
Lymphocytes Relative: 25.4 % (ref 12.0–46.0)
Lymphs Abs: 1.2 K/uL (ref 0.7–4.0)
MCHC: 33.7 g/dL (ref 30.0–36.0)
MCV: 85.7 fl (ref 78.0–100.0)
Monocytes Absolute: 0.3 K/uL (ref 0.1–1.0)
Monocytes Relative: 7 % (ref 3.0–12.0)
Neutro Abs: 3 K/uL (ref 1.4–7.7)
Neutrophils Relative %: 66.4 % (ref 43.0–77.0)
Platelets: 200 K/uL (ref 150.0–400.0)
RBC: 4.4 Mil/uL (ref 3.87–5.11)
RDW: 12.4 % (ref 11.5–15.5)
WBC: 4.6 K/uL (ref 4.0–10.5)

## 2024-04-02 ENCOUNTER — Ambulatory Visit: Payer: Self-pay | Admitting: Pulmonary Disease

## 2024-05-07 ENCOUNTER — Ambulatory Visit (INDEPENDENT_AMBULATORY_CARE_PROVIDER_SITE_OTHER)

## 2024-05-07 ENCOUNTER — Ambulatory Visit (INDEPENDENT_AMBULATORY_CARE_PROVIDER_SITE_OTHER): Admitting: Audiology

## 2024-06-04 ENCOUNTER — Ambulatory Visit: Admitting: Pulmonary Disease

## 2024-06-04 ENCOUNTER — Encounter

## 2024-06-18 ENCOUNTER — Ambulatory Visit (INDEPENDENT_AMBULATORY_CARE_PROVIDER_SITE_OTHER): Admitting: Audiology

## 2024-06-18 ENCOUNTER — Ambulatory Visit (INDEPENDENT_AMBULATORY_CARE_PROVIDER_SITE_OTHER)
